# Patient Record
Sex: Female | Born: 1945 | Race: White | Hispanic: No | Marital: Married | Smoking: Current every day smoker
Health system: Southern US, Community
[De-identification: ages and names within clinical notes are randomized; demographics above are authoritative.]

## PROBLEM LIST (undated history)

## (undated) DIAGNOSIS — Z923 Personal history of irradiation: Secondary | ICD-10-CM

## (undated) DIAGNOSIS — M810 Age-related osteoporosis without current pathological fracture: Secondary | ICD-10-CM

## (undated) DIAGNOSIS — C189 Malignant neoplasm of colon, unspecified: Secondary | ICD-10-CM

## (undated) DIAGNOSIS — Z972 Presence of dental prosthetic device (complete) (partial): Secondary | ICD-10-CM

## (undated) DIAGNOSIS — E785 Hyperlipidemia, unspecified: Secondary | ICD-10-CM

## (undated) DIAGNOSIS — C50919 Malignant neoplasm of unspecified site of unspecified female breast: Secondary | ICD-10-CM

## (undated) DIAGNOSIS — M1990 Unspecified osteoarthritis, unspecified site: Secondary | ICD-10-CM

## (undated) DIAGNOSIS — C801 Malignant (primary) neoplasm, unspecified: Secondary | ICD-10-CM

## (undated) DIAGNOSIS — E041 Nontoxic single thyroid nodule: Secondary | ICD-10-CM

## (undated) HISTORY — PX: KNEE SURGERY: SHX244

## (undated) HISTORY — PX: TUBAL LIGATION: SHX77

## (undated) HISTORY — PX: TONSILLECTOMY: SUR1361

## (undated) HISTORY — PX: BREAST BIOPSY: SHX20

## (undated) HISTORY — PX: KNEE ARTHROSCOPY: SUR90

## (undated) HISTORY — PX: BREAST LUMPECTOMY: SHX2

## (undated) HISTORY — PX: CHOLECYSTECTOMY: SHX55

---

## 2003-02-22 ENCOUNTER — Other Ambulatory Visit: Payer: Self-pay

## 2007-01-15 DIAGNOSIS — C801 Malignant (primary) neoplasm, unspecified: Secondary | ICD-10-CM

## 2007-01-15 DIAGNOSIS — Z923 Personal history of irradiation: Secondary | ICD-10-CM

## 2007-01-15 HISTORY — DX: Personal history of irradiation: Z92.3

## 2007-01-15 HISTORY — DX: Malignant (primary) neoplasm, unspecified: C80.1

## 2007-01-15 HISTORY — PX: BREAST LUMPECTOMY: SHX2

## 2007-01-15 HISTORY — PX: BREAST EXCISIONAL BIOPSY: SUR124

## 2007-02-17 ENCOUNTER — Ambulatory Visit: Payer: Self-pay | Admitting: Obstetrics and Gynecology

## 2007-02-20 ENCOUNTER — Ambulatory Visit: Payer: Self-pay | Admitting: Obstetrics and Gynecology

## 2007-04-30 ENCOUNTER — Ambulatory Visit: Payer: Self-pay | Admitting: Surgery

## 2007-05-08 ENCOUNTER — Ambulatory Visit: Payer: Self-pay | Admitting: Surgery

## 2007-05-26 ENCOUNTER — Ambulatory Visit: Payer: Self-pay | Admitting: Surgery

## 2007-06-15 ENCOUNTER — Ambulatory Visit: Payer: Self-pay | Admitting: Internal Medicine

## 2007-07-15 ENCOUNTER — Ambulatory Visit: Payer: Self-pay | Admitting: Internal Medicine

## 2007-08-15 ENCOUNTER — Ambulatory Visit: Payer: Self-pay | Admitting: Internal Medicine

## 2007-09-15 ENCOUNTER — Ambulatory Visit: Payer: Self-pay | Admitting: Internal Medicine

## 2007-10-15 ENCOUNTER — Ambulatory Visit: Payer: Self-pay | Admitting: Internal Medicine

## 2007-11-15 ENCOUNTER — Ambulatory Visit: Payer: Self-pay | Admitting: Internal Medicine

## 2007-11-30 ENCOUNTER — Ambulatory Visit: Payer: Self-pay | Admitting: Internal Medicine

## 2007-12-15 ENCOUNTER — Ambulatory Visit: Payer: Self-pay | Admitting: Internal Medicine

## 2008-01-15 ENCOUNTER — Ambulatory Visit: Payer: Self-pay | Admitting: Internal Medicine

## 2008-03-10 ENCOUNTER — Ambulatory Visit: Payer: Self-pay | Admitting: Internal Medicine

## 2008-03-10 ENCOUNTER — Ambulatory Visit: Payer: Self-pay | Admitting: Radiation Oncology

## 2008-03-14 ENCOUNTER — Ambulatory Visit: Payer: Self-pay | Admitting: Radiation Oncology

## 2008-03-29 ENCOUNTER — Ambulatory Visit: Payer: Self-pay | Admitting: Internal Medicine

## 2008-04-14 ENCOUNTER — Ambulatory Visit: Payer: Self-pay | Admitting: Internal Medicine

## 2008-04-14 ENCOUNTER — Ambulatory Visit: Payer: Self-pay | Admitting: Radiation Oncology

## 2008-07-26 ENCOUNTER — Ambulatory Visit: Payer: Self-pay | Admitting: Internal Medicine

## 2008-08-14 ENCOUNTER — Ambulatory Visit: Payer: Self-pay | Admitting: Radiation Oncology

## 2008-08-26 ENCOUNTER — Ambulatory Visit: Payer: Self-pay | Admitting: Internal Medicine

## 2008-09-14 ENCOUNTER — Ambulatory Visit: Payer: Self-pay | Admitting: Radiation Oncology

## 2008-09-30 ENCOUNTER — Ambulatory Visit: Payer: Self-pay | Admitting: Internal Medicine

## 2008-10-14 ENCOUNTER — Ambulatory Visit: Payer: Self-pay | Admitting: Internal Medicine

## 2008-10-14 ENCOUNTER — Ambulatory Visit: Payer: Self-pay | Admitting: Radiation Oncology

## 2009-01-14 ENCOUNTER — Ambulatory Visit: Payer: Self-pay | Admitting: Internal Medicine

## 2009-01-30 ENCOUNTER — Ambulatory Visit: Payer: Self-pay | Admitting: Internal Medicine

## 2009-02-01 ENCOUNTER — Ambulatory Visit: Payer: Self-pay | Admitting: Internal Medicine

## 2009-02-14 ENCOUNTER — Ambulatory Visit: Payer: Self-pay | Admitting: Internal Medicine

## 2009-07-14 ENCOUNTER — Ambulatory Visit: Payer: Self-pay | Admitting: Internal Medicine

## 2009-08-01 ENCOUNTER — Ambulatory Visit: Payer: Self-pay | Admitting: Internal Medicine

## 2009-08-14 ENCOUNTER — Ambulatory Visit: Payer: Self-pay | Admitting: Internal Medicine

## 2009-09-14 ENCOUNTER — Ambulatory Visit: Payer: Self-pay | Admitting: Internal Medicine

## 2010-02-22 ENCOUNTER — Ambulatory Visit: Payer: Self-pay | Admitting: Internal Medicine

## 2010-02-26 ENCOUNTER — Ambulatory Visit: Payer: Self-pay | Admitting: Internal Medicine

## 2010-03-15 ENCOUNTER — Ambulatory Visit: Payer: Self-pay | Admitting: Internal Medicine

## 2010-08-22 ENCOUNTER — Ambulatory Visit: Payer: Self-pay | Admitting: Internal Medicine

## 2010-08-24 ENCOUNTER — Ambulatory Visit: Payer: Self-pay | Admitting: Internal Medicine

## 2010-09-15 ENCOUNTER — Ambulatory Visit: Payer: Self-pay | Admitting: Internal Medicine

## 2011-01-15 DIAGNOSIS — C189 Malignant neoplasm of colon, unspecified: Secondary | ICD-10-CM

## 2011-01-15 HISTORY — DX: Malignant neoplasm of colon, unspecified: C18.9

## 2011-02-26 ENCOUNTER — Ambulatory Visit: Payer: Self-pay | Admitting: Internal Medicine

## 2011-03-11 ENCOUNTER — Ambulatory Visit: Payer: Self-pay | Admitting: Internal Medicine

## 2011-03-11 LAB — CBC CANCER CENTER
Basophil %: 0.3 %
Eosinophil #: 0.1 x10 3/mm (ref 0.0–0.7)
HCT: 36.1 % (ref 35.0–47.0)
HGB: 12.5 g/dL (ref 12.0–16.0)
Lymphocyte #: 1.5 x10 3/mm (ref 1.0–3.6)
MCH: 31.9 pg (ref 26.0–34.0)
MCHC: 34.5 g/dL (ref 32.0–36.0)
Monocyte #: 0.4 x10 3/mm (ref 0.0–0.7)
Neutrophil %: 67.5 %
Platelet: 185 x10 3/mm (ref 150–440)

## 2011-03-11 LAB — HEPATIC FUNCTION PANEL A (ARMC)
Alkaline Phosphatase: 91 U/L (ref 50–136)
Bilirubin, Direct: 0.1 mg/dL (ref 0.00–0.20)
SGOT(AST): 20 U/L (ref 15–37)

## 2011-03-11 LAB — CREATININE, SERUM
EGFR (African American): >60
EGFR (Non-African Amer.): >60

## 2011-03-15 ENCOUNTER — Ambulatory Visit: Payer: Self-pay | Admitting: Internal Medicine

## 2011-08-27 ENCOUNTER — Ambulatory Visit: Payer: Self-pay | Admitting: Internal Medicine

## 2011-09-20 ENCOUNTER — Ambulatory Visit: Payer: Self-pay | Admitting: Internal Medicine

## 2011-09-20 LAB — HEPATIC FUNCTION PANEL A (ARMC)
Albumin: 4 g/dL (ref 3.4–5.0)
Alkaline Phosphatase: 100 U/L (ref 50–136)
Bilirubin, Direct: 0.2 mg/dL (ref 0.00–0.20)
SGOT(AST): 24 U/L (ref 15–37)
Total Protein: 7.3 g/dL (ref 6.4–8.2)

## 2011-09-20 LAB — CBC CANCER CENTER
Basophil #: 0 x10 3/mm (ref 0.0–0.1)
Basophil %: 0.6 %
Eosinophil #: 0.1 x10 3/mm (ref 0.0–0.7)
Eosinophil %: 1.7 %
HCT: 41.4 % (ref 35.0–47.0)
HGB: 13.6 g/dL (ref 12.0–16.0)
Lymphocyte #: 1.8 x10 3/mm (ref 1.0–3.6)
Lymphocyte %: 32 %
MCHC: 32.9 g/dL (ref 32.0–36.0)
Monocyte #: 0.4 x10 3/mm (ref 0.2–0.9)
Neutrophil %: 58.3 %
Platelet: 182 x10 3/mm (ref 150–440)
WBC: 5.5 x10 3/mm (ref 3.6–11.0)

## 2011-09-20 LAB — CREATININE, SERUM: EGFR (African American): >60

## 2011-10-15 ENCOUNTER — Ambulatory Visit: Payer: Self-pay | Admitting: Internal Medicine

## 2012-02-18 ENCOUNTER — Ambulatory Visit: Payer: Self-pay | Admitting: Internal Medicine

## 2012-03-19 ENCOUNTER — Ambulatory Visit: Payer: Self-pay | Admitting: Internal Medicine

## 2012-03-25 LAB — CBC CANCER CENTER
Basophil %: 0.9 %
Eosinophil #: 0.1 x10 3/mm (ref 0.0–0.7)
Eosinophil %: 2.3 %
HCT: 36.8 % (ref 35.0–47.0)
HGB: 12.4 g/dL (ref 12.0–16.0)
Lymphocyte %: 31.4 %
MCHC: 33.6 g/dL (ref 32.0–36.0)
MCV: 92 fL (ref 80–100)
Monocyte #: 0.4 x10 3/mm (ref 0.2–0.9)
Platelet: 159 x10 3/mm (ref 150–440)
RBC: 4.02 10*6/uL (ref 3.80–5.20)

## 2012-03-25 LAB — BASIC METABOLIC PANEL
BUN: 15 mg/dL (ref 7–18)
Calcium, Total: 9.3 mg/dL (ref 8.5–10.1)
Co2: 30 mmol/L (ref 21–32)
EGFR (Non-African Amer.): >60
Osmolality: 287 (ref 275–301)

## 2012-03-25 LAB — HEPATIC FUNCTION PANEL A (ARMC)
SGOT(AST): 16 U/L (ref 15–37)
SGPT (ALT): 21 U/L (ref 12–78)
Total Protein: 6.7 g/dL (ref 6.4–8.2)

## 2012-04-14 ENCOUNTER — Ambulatory Visit: Payer: Self-pay | Admitting: Internal Medicine

## 2012-06-17 ENCOUNTER — Ambulatory Visit: Payer: Self-pay | Admitting: Orthopedic Surgery

## 2012-08-21 ENCOUNTER — Ambulatory Visit: Payer: Self-pay | Admitting: Unknown Physician Specialty

## 2013-01-14 HISTORY — PX: COLON SURGERY: SHX602

## 2013-01-29 ENCOUNTER — Ambulatory Visit: Payer: Self-pay | Admitting: Gastroenterology

## 2013-02-24 ENCOUNTER — Ambulatory Visit: Payer: Self-pay | Admitting: Internal Medicine

## 2013-02-25 ENCOUNTER — Ambulatory Visit: Payer: Self-pay | Admitting: Surgery

## 2013-02-25 LAB — CBC WITH DIFFERENTIAL/PLATELET
BASOS ABS: 0 10*3/uL (ref 0.0–0.1)
Basophil %: 0.6 %
EOS ABS: 0.1 10*3/uL (ref 0.0–0.7)
Eosinophil %: 2 %
HCT: 38.6 % (ref 35.0–47.0)
HGB: 12.5 g/dL (ref 12.0–16.0)
LYMPHS ABS: 1.8 10*3/uL (ref 1.0–3.6)
Lymphocyte %: 28.8 %
MCH: 30.1 pg (ref 26.0–34.0)
MCHC: 32.3 g/dL (ref 32.0–36.0)
MCV: 93 fL (ref 80–100)
Monocyte #: 0.4 x10 3/mm (ref 0.2–0.9)
Monocyte %: 6.9 %
Neutrophil #: 3.9 10*3/uL (ref 1.4–6.5)
Neutrophil %: 61.7 %
PLATELETS: 210 10*3/uL (ref 150–440)
RBC: 4.15 10*6/uL (ref 3.80–5.20)
RDW: 12.1 % (ref 11.5–14.5)
WBC: 6.3 10*3/uL (ref 3.6–11.0)

## 2013-02-25 LAB — BASIC METABOLIC PANEL
ANION GAP: 2 — AB (ref 7–16)
BUN: 19 mg/dL — ABNORMAL HIGH (ref 7–18)
CALCIUM: 9.5 mg/dL (ref 8.5–10.1)
CHLORIDE: 108 mmol/L — AB (ref 98–107)
CO2: 28 mmol/L (ref 21–32)
CREATININE: 0.82 mg/dL (ref 0.60–1.30)
EGFR (African American): >60
GLUCOSE: 85 mg/dL (ref 65–99)
OSMOLALITY: 277 (ref 275–301)
Potassium: 4.1 mmol/L (ref 3.5–5.1)
Sodium: 138 mmol/L (ref 136–145)

## 2013-02-26 LAB — CEA: CEA: 2 ng/mL (ref 0.0–4.7)

## 2013-02-26 LAB — HEPATIC FUNCTION PANEL A (ARMC)
ALBUMIN: 3.8 g/dL (ref 3.4–5.0)
ALK PHOS: 85 U/L
BILIRUBIN TOTAL: 0.5 mg/dL (ref 0.2–1.0)
Bilirubin, Direct: <0.1 mg/dL (ref 0.00–0.20)
SGOT(AST): 15 U/L (ref 15–37)
SGPT (ALT): 24 U/L (ref 12–78)
TOTAL PROTEIN: 7.3 g/dL (ref 6.4–8.2)

## 2013-03-01 ENCOUNTER — Ambulatory Visit: Payer: Self-pay | Admitting: Internal Medicine

## 2013-03-03 ENCOUNTER — Inpatient Hospital Stay: Payer: Self-pay | Admitting: Surgery

## 2013-03-03 LAB — CREATININE, SERUM
Creatinine: 0.95 mg/dL (ref 0.60–1.30)
EGFR (African American): >60

## 2013-03-05 LAB — BASIC METABOLIC PANEL
Anion Gap: 2 — ABNORMAL LOW (ref 7–16)
BUN: 2 mg/dL — ABNORMAL LOW (ref 7–18)
CHLORIDE: 108 mmol/L — AB (ref 98–107)
CREATININE: 0.8 mg/dL (ref 0.60–1.30)
Calcium, Total: 8.9 mg/dL (ref 8.5–10.1)
Co2: 29 mmol/L (ref 21–32)
EGFR (African American): >60
EGFR (Non-African Amer.): >60
Glucose: 116 mg/dL — ABNORMAL HIGH (ref 65–99)
Osmolality: 275 (ref 275–301)
POTASSIUM: 3.7 mmol/L (ref 3.5–5.1)
Sodium: 139 mmol/L (ref 136–145)

## 2013-03-05 LAB — CBC WITH DIFFERENTIAL/PLATELET
BASOS PCT: 0.5 %
Basophil #: 0 10*3/uL (ref 0.0–0.1)
EOS ABS: 0.1 10*3/uL (ref 0.0–0.7)
Eosinophil %: 1.4 %
HCT: 29.9 % — ABNORMAL LOW (ref 35.0–47.0)
HGB: 10.3 g/dL — ABNORMAL LOW (ref 12.0–16.0)
Lymphocyte #: 0.9 10*3/uL — ABNORMAL LOW (ref 1.0–3.6)
Lymphocyte %: 12.1 %
MCH: 31.2 pg (ref 26.0–34.0)
MCHC: 34.3 g/dL (ref 32.0–36.0)
MCV: 91 fL (ref 80–100)
Monocyte #: 0.5 x10 3/mm (ref 0.2–0.9)
Monocyte %: 6.9 %
NEUTROS PCT: 79.1 %
Neutrophil #: 5.9 10*3/uL (ref 1.4–6.5)
PLATELETS: 122 10*3/uL — AB (ref 150–440)
RBC: 3.28 10*6/uL — AB (ref 3.80–5.20)
RDW: 11.9 % (ref 11.5–14.5)
WBC: 7.5 10*3/uL (ref 3.6–11.0)

## 2013-03-05 LAB — PATHOLOGY REPORT

## 2013-03-14 ENCOUNTER — Ambulatory Visit: Payer: Self-pay | Admitting: Internal Medicine

## 2013-03-17 ENCOUNTER — Ambulatory Visit: Payer: Self-pay | Admitting: Internal Medicine

## 2013-03-29 LAB — BASIC METABOLIC PANEL
Anion Gap: 1 — ABNORMAL LOW (ref 7–16)
BUN: 14 mg/dL (ref 7–18)
CHLORIDE: 109 mmol/L — AB (ref 98–107)
CREATININE: 0.85 mg/dL (ref 0.60–1.30)
Calcium, Total: 9.3 mg/dL (ref 8.5–10.1)
Co2: 29 mmol/L (ref 21–32)
EGFR (African American): >60
GLUCOSE: 93 mg/dL (ref 65–99)
Osmolality: 278 (ref 275–301)
POTASSIUM: 4.5 mmol/L (ref 3.5–5.1)
Sodium: 139 mmol/L (ref 136–145)

## 2013-04-14 ENCOUNTER — Ambulatory Visit: Payer: Self-pay | Admitting: Internal Medicine

## 2014-03-22 ENCOUNTER — Ambulatory Visit: Payer: Self-pay | Admitting: Internal Medicine

## 2014-03-22 DIAGNOSIS — R922 Inconclusive mammogram: Secondary | ICD-10-CM | POA: Diagnosis not present

## 2014-04-04 ENCOUNTER — Ambulatory Visit
Admit: 2014-04-04 | Disposition: A | Discharge status: Home / Self Care | Payer: Self-pay | Attending: Internal Medicine | Admitting: Internal Medicine

## 2014-04-04 DIAGNOSIS — M818 Other osteoporosis without current pathological fracture: Secondary | ICD-10-CM | POA: Diagnosis not present

## 2014-04-04 DIAGNOSIS — E079 Disorder of thyroid, unspecified: Secondary | ICD-10-CM | POA: Diagnosis not present

## 2014-04-04 DIAGNOSIS — Z9049 Acquired absence of other specified parts of digestive tract: Secondary | ICD-10-CM | POA: Diagnosis not present

## 2014-04-04 DIAGNOSIS — F17210 Nicotine dependence, cigarettes, uncomplicated: Secondary | ICD-10-CM | POA: Diagnosis not present

## 2014-04-04 DIAGNOSIS — Z85038 Personal history of other malignant neoplasm of large intestine: Secondary | ICD-10-CM | POA: Diagnosis not present

## 2014-04-04 DIAGNOSIS — Z853 Personal history of malignant neoplasm of breast: Secondary | ICD-10-CM | POA: Diagnosis not present

## 2014-04-05 DIAGNOSIS — Z853 Personal history of malignant neoplasm of breast: Secondary | ICD-10-CM | POA: Diagnosis not present

## 2014-04-05 DIAGNOSIS — F17210 Nicotine dependence, cigarettes, uncomplicated: Secondary | ICD-10-CM | POA: Diagnosis not present

## 2014-04-05 DIAGNOSIS — Z85038 Personal history of other malignant neoplasm of large intestine: Secondary | ICD-10-CM | POA: Diagnosis not present

## 2014-04-05 DIAGNOSIS — E079 Disorder of thyroid, unspecified: Secondary | ICD-10-CM | POA: Diagnosis not present

## 2014-04-05 DIAGNOSIS — M818 Other osteoporosis without current pathological fracture: Secondary | ICD-10-CM | POA: Diagnosis not present

## 2014-04-05 DIAGNOSIS — E041 Nontoxic single thyroid nodule: Secondary | ICD-10-CM | POA: Diagnosis not present

## 2014-04-05 DIAGNOSIS — Z9049 Acquired absence of other specified parts of digestive tract: Secondary | ICD-10-CM | POA: Diagnosis not present

## 2014-04-05 DIAGNOSIS — C189 Malignant neoplasm of colon, unspecified: Secondary | ICD-10-CM | POA: Diagnosis not present

## 2014-04-15 ENCOUNTER — Ambulatory Visit
Admit: 2014-04-15 | Disposition: A | Discharge status: Home / Self Care | Payer: Self-pay | Attending: Internal Medicine | Admitting: Internal Medicine

## 2014-04-19 DIAGNOSIS — Z85038 Personal history of other malignant neoplasm of large intestine: Secondary | ICD-10-CM | POA: Diagnosis not present

## 2014-04-19 DIAGNOSIS — Z853 Personal history of malignant neoplasm of breast: Secondary | ICD-10-CM | POA: Diagnosis not present

## 2014-04-19 DIAGNOSIS — Z79899 Other long term (current) drug therapy: Secondary | ICD-10-CM | POA: Diagnosis not present

## 2014-04-19 DIAGNOSIS — M818 Other osteoporosis without current pathological fracture: Secondary | ICD-10-CM | POA: Diagnosis not present

## 2014-04-19 DIAGNOSIS — M8589 Other specified disorders of bone density and structure, multiple sites: Secondary | ICD-10-CM | POA: Diagnosis not present

## 2014-04-21 DIAGNOSIS — E041 Nontoxic single thyroid nodule: Secondary | ICD-10-CM | POA: Diagnosis not present

## 2014-04-29 ENCOUNTER — Other Ambulatory Visit: Payer: Self-pay | Admitting: Unknown Physician Specialty

## 2014-04-29 DIAGNOSIS — M818 Other osteoporosis without current pathological fracture: Secondary | ICD-10-CM | POA: Diagnosis not present

## 2014-04-29 DIAGNOSIS — Z853 Personal history of malignant neoplasm of breast: Secondary | ICD-10-CM | POA: Diagnosis not present

## 2014-04-29 DIAGNOSIS — Z79899 Other long term (current) drug therapy: Secondary | ICD-10-CM | POA: Diagnosis not present

## 2014-04-29 DIAGNOSIS — E041 Nontoxic single thyroid nodule: Secondary | ICD-10-CM

## 2014-04-29 DIAGNOSIS — Z85038 Personal history of other malignant neoplasm of large intestine: Secondary | ICD-10-CM | POA: Diagnosis not present

## 2014-04-29 LAB — CREATININE, SERUM
Creatinine: 0.8 mg/dL
EGFR (African American): >60
EGFR (Non-African Amer.): >60

## 2014-04-29 LAB — CALCIUM: Calcium, Total: 10.5 mg/dL — ABNORMAL HIGH

## 2014-05-07 NOTE — Op Note (Signed)
PATIENT NAME:  Laura Graham, Laura Graham MR#:  [id number] DATE OF BIRTH:  February 10, 1945  DATE OF PROCEDURE:  03/03/2013  PREOPERATIVE DIAGNOSIS: Sigmoid colon carcinoma.   POSTOPERATIVE DIAGNOSIS: Sigmoid colon carcinoma.   OPERATION: Robot-assisted laparoscopic sigmoid resection.   ANESTHESIA: General.   SURGEON: Rodena Goldmann.   OPERATIVE PROCEDURE: With the patient in the supine position, after induction of appropriate general anesthesia, the patient was placed in the lithotomy position, appropriately padded and positioned. Foley catheter was placed. Her abdomen was prepped with Betadine and draped with alcohol and draped in sterile towels. A right upper quadrant incision 2 cm above and 2 lateral to the umbilicus was made transversely and Veress needle used to cannulate the peritoneal cavity. CO2 was insufflated to appropriate pressure measurements. A 12 mm port was inserted into the abdominal cavity and position confirmed with the laparoscope. A subxiphoid transverse incision was made and 8.5 robotic port inserted. A left mid abdominal incision was made laterally and a 8 mm abdominal robotic port inserted. In the right lower quadrant transverse incision was made and an 8 mm robotic port inserted. The patient was appropriately positioned and the robot brought to the table and docked. The descending colon and splenic flexure were addressed first. The colon was mobilized around the descending and splenic flexure without difficulty. A combination of blunt and Bovie dissection was utilized to approximately the third of the transverse colon. Examining the distal sigmoid the lesion appeared to be in the proximal sigmoid and the ink tattoo was visualized. The patient was then undocked, rotated slightly, then redocked to better approach the pelvis. The left ureter was identified over the left iliac artery and vein and carefully preserved. Dissection was carried out down into the pelvis. The bowel appeared to be  sufficiently mobile. The abdomen was then desufflated after undocking the patient and repositioning the patient. A 6 cm left lower quadrant incision was made to extend the previous incision, carried down through the subcutaneous tissue using Bovie electrocautery. Anterior fascia was identified and incised the length of the skin incision as was the peritoneum. The bowel was visualized, elevated the incision without difficulty. An area was chosen approximately 5 to 6 cm in either side of the palpable lesion. Bowel was divided with a GIA 55 stapling device. A wedge of mesentery was then dissected free using the LigaSure apparatus. Two pieces of bowel were placed side by side. A small enterotomy was made in each loop of bowel along the antimesenteric border and another application of the GIA stapling device used to create a common channel. The anastomosis appeared to be satisfactory. The enterotomy was closed with 3-0 silk in a single application of the IO-97 stapling device carrying a blue load. Seromuscular suture placed over the anastomosis closure. Mesenteric defect was closed with 3-0 Vicryl in a running fashion. Bowel contents were returned to their anatomic position. The area was copiously irrigated. Anastomosis appeared to be satisfactory. Bowel contents were returned to their anatomic position. A wound protector had been utilized and was removed. The peritoneum was closed with running suture of 0 Vicryl. The fascia closed with interrupted figure-of-eight sutures of 0 Maxon. The skin was closed with 3-0 nylon, benzoin and Steri-Strips. Wounds were dressed sterilely. The patient taken to the recovery room having tolerated the procedure  well. Sponge, instrument and needle counts were correct x 2 in the operating room.    ____________________________ Micheline Maze, MD rle:sg D: 03/03/2013 11:37:54 ET T: 03/03/2013 12:44:42 ET JOB#: [id number]  cc: Micheline Maze, MD, <Dictator> Lucilla Lame, MD  Rodena Goldmann MD ELECTRONICALLY SIGNED 03/04/2013 12:56

## 2014-05-07 NOTE — Discharge Summary (Signed)
PATIENT NAME:  Laura Graham, Laura Graham MR#:  [id number] DATE OF BIRTH:  15-Dec-1945  DATE OF ADMISSION:  03/03/2013 DATE OF DISCHARGE:  03/08/2013  BRIEF HISTORY: Laura Graham is a 69 year old woman with a recently identified colon carcinoma. She was admitted with plan of pursuing elective colon resection. After appropriate preoperative preparation and informed consent, she was taken to surgery on the morning of March 03, 2013. The patient had undergone mechanical bowel prep. A robot-assisted laparoscopic sigmoid colectomy was performed without difficulty. She had no significant postoperative problems. She had rapid return of bowel function. She was able to tolerate a liquid diet on the second hospital day, advanced to a soft diet on the fourth hospital day. She is up and ambulating without difficulty, tolerating a regular diet. Her wounds look good. There is no sign of any infection.   DISCHARGE MEDICATIONS: Include vitamin D3 2000 units once a day, Zantac 150 mg once a day p.r.n., calcium 600 mg once a day, acetaminophen 325/hydrocodone 5 every 4 to 6 hours p.r.n.   FINAL DISCHARGE DIAGNOSIS: Colon carcinoma.   SURGERY: Robot-assisted laparoscopic cholecystectomy.  ____________________________ Rodena Goldmann III, MD rle:aw D: 03/08/2013 07:08:49 ET T: 03/08/2013 10:29:00 ET JOB#: [id number]  cc: Rodena Goldmann III, MD, <Dictator> Lucilla Lame, MD Raye Sorrow, MD Rodena Goldmann MD ELECTRONICALLY SIGNED 03/17/2013 7:37

## 2014-05-31 DIAGNOSIS — Z85038 Personal history of other malignant neoplasm of large intestine: Secondary | ICD-10-CM | POA: Diagnosis not present

## 2014-06-23 ENCOUNTER — Other Ambulatory Visit: Payer: Self-pay

## 2014-06-23 ENCOUNTER — Telehealth: Payer: Self-pay

## 2014-06-23 NOTE — Telephone Encounter (Signed)
-----   Message from Otilio Jefferson sent at 06/20/2014  9:13 AM EDT ----- Regarding: Referrals From: Tia Masker Sent: 06/01/2014  Office Visit: 04/28/2013  triage

## 2014-06-23 NOTE — Telephone Encounter (Signed)
LVM for pt to return my call.

## 2014-06-27 ENCOUNTER — Other Ambulatory Visit: Payer: Self-pay

## 2014-06-27 DIAGNOSIS — Z85038 Personal history of other malignant neoplasm of large intestine: Secondary | ICD-10-CM

## 2014-06-27 MED ORDER — NA SULFATE-K SULFATE-MG SULF 17.5-3.13-1.6 GM/177ML PO SOLN: 1.0000 | ORAL | Status: DC

## 2014-06-27 NOTE — Telephone Encounter (Signed)
Pt scheduled for a colonoscopy on 07-22-14. Instructs/rx mailed.

## 2014-06-27 NOTE — Progress Notes (Signed)
Gastroenterology Pre-Procedure Review  Request Date: 07-22-14 Requesting Physician: Dr. Manuella Ghazi  PATIENT REVIEW QUESTIONS: The patient responded to the following health history questions as indicated:    1. Are you having any GI issues? no 2. Do you have a personal history of Polyps? yes (colon cancer) 3. Do you have a family history of Colon Cancer or Polyps? no 4. Diabetes Mellitus? no 5. Joint replacements in the past 12 months?no 6. Major health problems in the past 3 months?no 7. Any artificial heart valves, MVP, or defibrillator?no    MEDICATIONS & ALLERGIES:    Patient reports the following regarding taking any anticoagulation/antiplatelet therapy:   Plavix, Coumadin, Eliquis, Xarelto, Lovenox, Pradaxa, Brilinta, or Effient? no Aspirin? no  Patient confirms/reports the following medications:  No current outpatient prescriptions on file.   No current facility-administered medications for this visit.    Patient confirms/reports the following allergies:  Allergies not on file NKDA  No orders of the defined types were placed in this encounter.    AUTHORIZATION INFORMATION Primary Insurance: 1D#: Group #:  Secondary Insurance: 1D#: Group #:  SCHEDULE INFORMATION: Date: 06-27-14 Time: Location: [place]

## 2014-06-28 ENCOUNTER — Telehealth: Payer: Self-pay

## 2014-06-28 NOTE — Telephone Encounter (Signed)
-----   Message from Otilio Jefferson sent at 06/20/2014  9:13 AM EDT ----- Regarding: Referrals From: Tia Masker Sent: 06/01/2014  Office Visit: 04/28/2013  triage

## 2014-06-28 NOTE — Telephone Encounter (Signed)
Pt scheduled for colonoscopy at [facility] on 07-22-14. Instructs/rx mailed.

## 2014-07-13 ENCOUNTER — Encounter: Payer: Self-pay | Admitting: *Deleted

## 2014-07-20 NOTE — Discharge Instructions (Signed)

## 2014-07-22 ENCOUNTER — Encounter
Admission: RE | Disposition: A | Discharge status: Home / Self Care | Payer: Self-pay | Source: Ambulatory Visit | Attending: Gastroenterology

## 2014-07-22 ENCOUNTER — Encounter: Payer: Self-pay | Admitting: Gastroenterology

## 2014-07-22 ENCOUNTER — Ambulatory Visit
Admission: RE | Admit: 2014-07-22 | Discharge: 2014-07-22 | Disposition: A | Discharge status: Home / Self Care | Payer: Medicare Other | Source: Ambulatory Visit | Attending: Gastroenterology | Admitting: Gastroenterology

## 2014-07-22 ENCOUNTER — Ambulatory Visit: Payer: Medicare Other | Admitting: Anesthesiology

## 2014-07-22 ENCOUNTER — Other Ambulatory Visit: Payer: Self-pay | Admitting: Gastroenterology

## 2014-07-22 DIAGNOSIS — Z79899 Other long term (current) drug therapy: Secondary | ICD-10-CM | POA: Insufficient documentation

## 2014-07-22 DIAGNOSIS — F17200 Nicotine dependence, unspecified, uncomplicated: Secondary | ICD-10-CM | POA: Insufficient documentation

## 2014-07-22 DIAGNOSIS — K5730 Diverticulosis of large intestine without perforation or abscess without bleeding: Secondary | ICD-10-CM | POA: Diagnosis not present

## 2014-07-22 DIAGNOSIS — M19042 Primary osteoarthritis, left hand: Secondary | ICD-10-CM | POA: Diagnosis not present

## 2014-07-22 DIAGNOSIS — Z9049 Acquired absence of other specified parts of digestive tract: Secondary | ICD-10-CM | POA: Insufficient documentation

## 2014-07-22 DIAGNOSIS — M19041 Primary osteoarthritis, right hand: Secondary | ICD-10-CM | POA: Diagnosis not present

## 2014-07-22 DIAGNOSIS — M170 Bilateral primary osteoarthritis of knee: Secondary | ICD-10-CM | POA: Diagnosis not present

## 2014-07-22 DIAGNOSIS — D125 Benign neoplasm of sigmoid colon: Secondary | ICD-10-CM | POA: Insufficient documentation

## 2014-07-22 DIAGNOSIS — D122 Benign neoplasm of ascending colon: Secondary | ICD-10-CM | POA: Diagnosis not present

## 2014-07-22 DIAGNOSIS — Z09 Encounter for follow-up examination after completed treatment for conditions other than malignant neoplasm: Secondary | ICD-10-CM | POA: Diagnosis present

## 2014-07-22 DIAGNOSIS — Z85038 Personal history of other malignant neoplasm of large intestine: Secondary | ICD-10-CM | POA: Insufficient documentation

## 2014-07-22 DIAGNOSIS — Z853 Personal history of malignant neoplasm of breast: Secondary | ICD-10-CM | POA: Insufficient documentation

## 2014-07-22 DIAGNOSIS — Z980 Intestinal bypass and anastomosis status: Secondary | ICD-10-CM | POA: Insufficient documentation

## 2014-07-22 HISTORY — PX: COLONOSCOPY WITH PROPOFOL: SHX5780

## 2014-07-22 HISTORY — DX: Malignant (primary) neoplasm, unspecified: C80.1

## 2014-07-22 HISTORY — PX: POLYPECTOMY: SHX5525

## 2014-07-22 HISTORY — DX: Unspecified osteoarthritis, unspecified site: M19.90

## 2014-07-22 HISTORY — DX: Presence of dental prosthetic device (complete) (partial): Z97.2

## 2014-07-22 SURGERY — COLONOSCOPY WITH PROPOFOL
Anesthesia: Monitor Anesthesia Care | Wound class: Contaminated

## 2014-07-22 MED ORDER — LACTATED RINGERS IV SOLN
INTRAVENOUS | Status: DC
  Administered 2014-07-22: 08:00:00 via INTRAVENOUS

## 2014-07-22 MED ORDER — PROPOFOL 10 MG/ML IV BOLUS
INTRAVENOUS | Status: DC | PRN
  Administered 2014-07-22: 20 mg via INTRAVENOUS
  Administered 2014-07-22: 30 mg via INTRAVENOUS
  Administered 2014-07-22 (×2): 20 mg via INTRAVENOUS
  Administered 2014-07-22: 30 mg via INTRAVENOUS
  Administered 2014-07-22: 20 mg via INTRAVENOUS
  Administered 2014-07-22: 50 mg via INTRAVENOUS
  Administered 2014-07-22: 30 mg via INTRAVENOUS

## 2014-07-22 MED ORDER — STERILE WATER FOR IRRIGATION IR SOLN
Status: DC | PRN
  Administered 2014-07-22: 09:00:00

## 2014-07-22 MED ORDER — LIDOCAINE HCL (CARDIAC) 20 MG/ML IV SOLN
INTRAVENOUS | Status: DC | PRN
  Administered 2014-07-22: 50 mg via INTRAVENOUS

## 2014-07-22 SURGICAL SUPPLY — 28 items
CANISTER SUCT 1200ML W/VALVE (MISCELLANEOUS) ×4 IMPLANT
FCP ESCP3.2XJMB 240X2.8X (MISCELLANEOUS)
FORCEPS BIOP RAD 4 LRG CAP 4 (CUTTING FORCEPS) IMPLANT
FORCEPS BIOP RJ4 240 W/NDL (MISCELLANEOUS)
FORCEPS ESCP3.2XJMB 240X2.8X (MISCELLANEOUS) IMPLANT
GOWN CVR UNV OPN BCK APRN NK (MISCELLANEOUS) ×4 IMPLANT
GOWN ISOL THUMB LOOP REG UNIV (MISCELLANEOUS) ×4
HEMOCLIP INSTINCT (CLIP) IMPLANT
INJECTOR VARIJECT VIN23 (MISCELLANEOUS) IMPLANT
KIT CO2 TUBING (TUBING) IMPLANT
KIT DEFENDO VALVE AND CONN (KITS) IMPLANT
KIT ENDO PROCEDURE OLY (KITS) ×4 IMPLANT
LIGATOR MULTIBAND 6SHOOTER MBL (MISCELLANEOUS) IMPLANT
MARKER SPOT ENDO TATTOO 5ML (MISCELLANEOUS) IMPLANT
PAD GROUND ADULT SPLIT (MISCELLANEOUS) IMPLANT
SNARE SHORT THROW 13M SML OVAL (MISCELLANEOUS) ×4 IMPLANT
SNARE SHORT THROW 30M LRG OVAL (MISCELLANEOUS) IMPLANT
SPOT EX ENDOSCOPIC TATTOO (MISCELLANEOUS)
SUCTION POLY TRAP 4CHAMBER (MISCELLANEOUS) IMPLANT
TRAP SUCTION POLY (MISCELLANEOUS) ×4 IMPLANT
TUBING CONN 6MMX3.1M (TUBING)
TUBING SUCTION CONN 0.25 STRL (TUBING) IMPLANT
UNDERPAD 30X60 958B10 (PK) (MISCELLANEOUS) IMPLANT
VALVE BIOPSY ENDO (VALVE) IMPLANT
VARIJECT INJECTOR VIN23 (MISCELLANEOUS)
WATER AUXILLARY (MISCELLANEOUS) IMPLANT
WATER STERILE IRR 250ML POUR (IV SOLUTION) ×4 IMPLANT
WATER STERILE IRR 500ML POUR (IV SOLUTION) IMPLANT

## 2014-07-22 NOTE — Anesthesia Preprocedure Evaluation (Addendum)
Anesthesia Evaluation  Patient identified by MRN, date of birth, ID band Patient awake    Reviewed: Allergy & Precautions, NPO status   Airway Mallampati: II  TM Distance: >3 FB Neck ROM: full    Dental   Front upper bridge:   Pulmonary Current Smoker,    Pulmonary exam normal       Cardiovascular negative cardio ROS Normal cardiovascular exam    Neuro/Psych negative neurological ROS     GI/Hepatic negative GI ROS, Neg liver ROS,   Endo/Other  negative endocrine ROS  Renal/GU negative Renal ROS  negative genitourinary   Musculoskeletal   Abdominal   Peds  Hematology negative hematology ROS (+)   Anesthesia Other Findings   Reproductive/Obstetrics negative OB ROS                            Anesthesia Physical Anesthesia Plan  ASA: II  Anesthesia Plan: MAC   Post-op Pain Management:    Induction: Intravenous  Airway Management Planned: Simple Face Mask  Additional Equipment:   Intra-op Plan:   Post-operative Plan:   Informed Consent: I have reviewed the patients History and Physical, chart, labs and discussed the procedure including the risks, benefits and alternatives for the proposed anesthesia with the patient or authorized representative who has indicated his/her understanding and acceptance.     Plan Discussed with:   Anesthesia Plan Comments:         Anesthesia Quick Evaluation

## 2014-07-22 NOTE — Anesthesia Postprocedure Evaluation (Signed)
  Anesthesia Post-op Note  Patient: Laura Graham  Procedure(s) Performed: Procedure(s): COLONOSCOPY WITH PROPOFOL (N/A) POLYPECTOMY  Anesthesia type:MAC  Patient location: PACU  Post pain: Pain level controlled  Post assessment: Post-op Vital signs reviewed, Patient's Cardiovascular Status Stable, Respiratory Function Stable, Patent Airway and No signs of Nausea or vomiting  Post vital signs: Reviewed and stable  Last Vitals:  Filed Vitals:   07/22/14 0851  BP: 83/41  Pulse:   Temp:   Resp:     Level of consciousness: awake, alert  and patient cooperative  Complications: No apparent anesthesia complications

## 2014-07-22 NOTE — Transfer of Care (Signed)
Immediate Anesthesia Transfer of Care Note  Patient: Laura Graham  Procedure(s) Performed: Procedure(s): COLONOSCOPY WITH PROPOFOL (N/A) POLYPECTOMY  Patient Location: PACU  Anesthesia Type: MAC  Level of Consciousness: awake, alert  and patient cooperative  Airway and Oxygen Therapy: Patient Spontanous Breathing and Patient connected to supplemental oxygen  Post-op Assessment: Post-op Vital signs reviewed, Patient's Cardiovascular Status Stable, Respiratory Function Stable, Patent Airway and No signs of Nausea or vomiting  Post-op Vital Signs: Reviewed and stable  Complications: No apparent anesthesia complications

## 2014-07-22 NOTE — Anesthesia Procedure Notes (Signed)
Procedure Name: MAC Performed by: Amiley Shishido Pre-anesthesia Checklist: Patient identified, Emergency Drugs available, Suction available, Timeout performed and Patient being monitored Patient Re-evaluated:Patient Re-evaluated prior to inductionOxygen Delivery Method: Nasal cannula Placement Confirmation: positive ETCO2       

## 2014-07-22 NOTE — Anesthesia Postprocedure Evaluation (Deleted)
  Anesthesia Post-op Note  Patient: Laura Graham  Procedure(s) Performed: Procedure(s): COLONOSCOPY WITH PROPOFOL (N/A) POLYPECTOMY  Anesthesia type:MAC  Patient location: PACU  Post pain: Pain level controlled  Post assessment: Post-op Vital signs reviewed, Patient's Cardiovascular Status Stable, Respiratory Function Stable, Patent Airway and No signs of Nausea or vomiting  Post vital signs: Reviewed and stable  Last Vitals:  Filed Vitals:   07/22/14 0851  BP: 83/41  Pulse:   Temp:   Resp:     Level of consciousness: awake, alert  and patient cooperative  Complications: No apparent anesthesia complications

## 2014-07-22 NOTE — H&P (Signed)
  [facility]  [address] [place], [place] [address] Phone: [phone] Fax : [phone]  Primary Care Physician:  Geri Seminole, MD Primary Gastroenterologist:  Dr. Allen Norris  Pre-Procedure History & Physical: HPI:  Laura Graham is a 69 y.o. female is here for an colonoscopy.   Past Medical History  Diagnosis Date  . Cancer     colon, breast  . Arthritis     fingers, hands, knees  . Degenerative joint disease     thumbs  . Dental bridge present     Past Surgical History  Procedure Laterality Date  . Tubal ligation    . Colon surgery  2015  . Cholecystectomy    . Tonsillectomy    . Knee surgery Right     Prior to Admission medications   Medication Sig Start Date End Date Taking? Authorizing Provider  calcium citrate (CALCITRATE - DOSED IN MG ELEMENTAL CALCIUM) 950 MG tablet Take 200 mg of elemental calcium by mouth daily.   Yes Historical Provider, MD  cholecalciferol (VITAMIN D) 1000 UNITS tablet Take 1,000 Units by mouth daily.   Yes Historical Provider, MD  glucosamine-chondroitin 500-400 MG tablet Take 1 tablet by mouth daily.   Yes Historical Provider, MD  Na Sulfate-K Sulfate-Mg Sulf (SUPREP BOWEL PREP) SOLN Take 1 kit by mouth as directed. 06/27/14  Yes Lucilla Lame, MD    Allergies as of 06/27/2014  . (No Known Allergies)    History reviewed. No pertinent family history.  History   Social History  . Marital Status: Married    Spouse Name: N/A  . Number of Children: N/A  . Years of Education: N/A   Occupational History  . Not on file.   Social History Main Topics  . Smoking status: Current Every Day Smoker -- 0.50 packs/day for 20 years  . Smokeless tobacco: Not on file  . Alcohol Use: 0.6 oz/week    1 Cans of beer per week  . Drug Use: Not on file  . Sexual Activity: Not on file   Other Topics Concern  . Not on file   Social History Narrative  . No narrative on file    Review of Systems: See HPI, otherwise negative  ROS  Physical Exam: BP 81/45 mmHg  Pulse 59  Temp(Src) 97.7 F (36.5 C) (Temporal)  Resp 16  Ht _0  (1.6 m)  Wt 131 lb (59.421 kg)  BMI 23.21 kg/m2  SpO2 98% General:   Alert,  pleasant and cooperative in NAD Head:  Normocephalic and atraumatic. Neck:  Supple; no masses or thyromegaly. Lungs:  Clear throughout to auscultation.    Heart:  Regular rate and rhythm. Abdomen:  Soft, nontender and nondistended. Normal bowel sounds, without guarding, and without rebound.   Neurologic:  Alert and  oriented x4;  grossly normal neurologically.  Impression/Plan: Laura Graham is here for an colonoscopy to be performed for history of colon cancer  Risks, benefits, limitations, and alternatives regarding  colonoscopy have been reviewed with the patient.  Questions have been answered.  All parties agreeable.   Ollen Bowl, MD  07/22/2014, 8:27 AM

## 2014-07-22 NOTE — Op Note (Signed)
[facility] Patient Name: Laura Graham Procedure Date: 07/22/2014 8:20 AM MRN: [id number] Account #: [id number] Date of Birth: [birth year] Admit Type: Outpatient Age: 69 Room: [facility] OR ROOM 01 Gender: Female Note Status: Finalized Procedure:         Colonoscopy Indications:       High risk colon cancer surveillance: Personal history of                     colon cancer Providers:         Lucilla Lame, MD Medicines:         Propofol per Anesthesia Complications:     No immediate complications. Procedure:         Pre-Anesthesia Assessment:                    - Prior to the procedure, a History and Physical was                     performed, and patient medications and allergies were                     reviewed. The patient's tolerance of previous anesthesia                     was also reviewed. The risks and benefits of the procedure                     and the sedation options and risks were discussed with the                     patient. All questions were answered, and informed consent                     was obtained. Prior Anticoagulants: The patient has taken                     no previous anticoagulant or antiplatelet agents. ASA                     Grade Assessment: II - A patient with mild systemic                     disease. After reviewing the risks and benefits, the                     patient was deemed in satisfactory condition to undergo                     the procedure.                    After obtaining informed consent, the colonoscope was                     passed under direct vision. Throughout the procedure, the                     patient's blood pressure, pulse, and oxygen saturations                     were monitored continuously. The was introduced through                     the anus and advanced to the the cecum, identified by  appendiceal orifice and ileocecal valve. The colonoscopy    was performed without difficulty. The patient tolerated                     the procedure well. The quality of the bowel preparation                     was excellent. Findings:      The perianal and digital rectal examinations were normal.      There was evidence of a prior end-to-side ileo-colonic anastomosis in       the sigmoid colon. This was patent. This was characterized by healthy       appearing mucosa. This was traversed.      Five sessile polyps were found in the sigmoid colon. The polyps were 2       to 4 mm in size. These polyps were removed with a cold snare. Resection       and retrieval were complete.      A few small-mouthed diverticula were found in the sigmoid colon.      A 6 mm polyp was found in the ascending colon. The polyp was sessile.       The polyp was removed with a cold snare. Resection and retrieval were       complete. Impression:        - Patent end-to-side ileo-colonic anastomosis,                     characterized by healthy appearing mucosa.                    - Five 2 to 4 mm polyps in the sigmoid colon. Resected and                     retrieved.                    - Diverticulosis in the sigmoid colon.                    - One 6 mm polyp in the ascending colon. Resected and                     retrieved. Recommendation:    - Await pathology results.                    - Repeat colonoscopy in 3 years for surveillance. Procedure Code(s): --- Professional ---                    [phone], Colonoscopy, flexible; with removal of tumor(s),                     polyp(s), or other lesion(s) by snare technique Diagnosis Code(s): --- Professional ---                    V95.638, Personal history of other malignant neoplasm of                     large intestine                    Z98.0, Intestinal bypass and anastomosis status                    D12.5, Benign neoplasm of sigmoid colon  D12.2, Benign neoplasm of ascending colon CPT copyright  2014 American Medical [employer]. All rights reserved. The codes documented in this report are preliminary and upon coder review may  be revised to meet current compliance requirements. Lucilla Lame, MD 07/22/2014 8:49:28 AM This report has been signed electronically. Number of Addenda: 0 Note Initiated On: 07/22/2014 8:20 AM Scope Withdrawal Time: 0 hours 8 minutes 38 seconds  Total Procedure Duration: 0 hours 14 minutes 36 seconds       [facility]

## 2014-08-09 ENCOUNTER — Encounter: Payer: Self-pay | Admitting: Gastroenterology

## 2014-09-23 ENCOUNTER — Encounter: Payer: Self-pay | Admitting: Obstetrics and Gynecology

## 2014-10-07 ENCOUNTER — Ambulatory Visit
Admission: RE | Admit: 2014-10-07 | Discharge: 2014-10-07 | Disposition: A | Discharge status: Home / Self Care | Payer: Medicare Other | Source: Ambulatory Visit | Attending: Unknown Physician Specialty | Admitting: Unknown Physician Specialty

## 2014-10-07 DIAGNOSIS — Z85038 Personal history of other malignant neoplasm of large intestine: Secondary | ICD-10-CM | POA: Diagnosis not present

## 2014-10-07 DIAGNOSIS — E041 Nontoxic single thyroid nodule: Secondary | ICD-10-CM | POA: Diagnosis present

## 2014-10-07 DIAGNOSIS — Z853 Personal history of malignant neoplasm of breast: Secondary | ICD-10-CM | POA: Insufficient documentation

## 2014-10-20 ENCOUNTER — Other Ambulatory Visit: Payer: Self-pay | Admitting: Unknown Physician Specialty

## 2014-10-20 DIAGNOSIS — E041 Nontoxic single thyroid nodule: Secondary | ICD-10-CM

## 2015-02-21 DIAGNOSIS — E789 Disorder of lipoprotein metabolism, unspecified: Secondary | ICD-10-CM | POA: Insufficient documentation

## 2015-02-21 DIAGNOSIS — Z853 Personal history of malignant neoplasm of breast: Secondary | ICD-10-CM | POA: Insufficient documentation

## 2015-02-21 DIAGNOSIS — E041 Nontoxic single thyroid nodule: Secondary | ICD-10-CM | POA: Insufficient documentation

## 2015-02-21 DIAGNOSIS — Z9889 Other specified postprocedural states: Secondary | ICD-10-CM | POA: Insufficient documentation

## 2015-02-21 DIAGNOSIS — F17210 Nicotine dependence, cigarettes, uncomplicated: Secondary | ICD-10-CM | POA: Insufficient documentation

## 2015-04-24 ENCOUNTER — Other Ambulatory Visit: Payer: Self-pay | Admitting: *Deleted

## 2015-04-24 ENCOUNTER — Ambulatory Visit: Payer: Self-pay

## 2015-04-24 ENCOUNTER — Other Ambulatory Visit: Payer: Self-pay

## 2015-04-24 ENCOUNTER — Inpatient Hospital Stay (HOSPITAL_BASED_OUTPATIENT_CLINIC_OR_DEPARTMENT_OTHER): Payer: Medicare Other | Admitting: Internal Medicine

## 2015-04-24 ENCOUNTER — Inpatient Hospital Stay: Payer: Medicare Other

## 2015-04-24 ENCOUNTER — Inpatient Hospital Stay: Payer: Medicare Other | Attending: Internal Medicine

## 2015-04-24 VITALS — BP 81/51 | HR 62 | Temp 97.3°F | Resp 18 | Wt 128.1 lb

## 2015-04-24 DIAGNOSIS — Z853 Personal history of malignant neoplasm of breast: Secondary | ICD-10-CM | POA: Insufficient documentation

## 2015-04-24 DIAGNOSIS — M1990 Unspecified osteoarthritis, unspecified site: Secondary | ICD-10-CM | POA: Diagnosis not present

## 2015-04-24 DIAGNOSIS — C50911 Malignant neoplasm of unspecified site of right female breast: Secondary | ICD-10-CM

## 2015-04-24 DIAGNOSIS — M129 Arthropathy, unspecified: Secondary | ICD-10-CM | POA: Insufficient documentation

## 2015-04-24 DIAGNOSIS — F17210 Nicotine dependence, cigarettes, uncomplicated: Secondary | ICD-10-CM

## 2015-04-24 DIAGNOSIS — Z79899 Other long term (current) drug therapy: Secondary | ICD-10-CM | POA: Insufficient documentation

## 2015-04-24 DIAGNOSIS — Z85038 Personal history of other malignant neoplasm of large intestine: Secondary | ICD-10-CM | POA: Insufficient documentation

## 2015-04-24 DIAGNOSIS — M818 Other osteoporosis without current pathological fracture: Secondary | ICD-10-CM

## 2015-04-24 DIAGNOSIS — C189 Malignant neoplasm of colon, unspecified: Secondary | ICD-10-CM

## 2015-04-24 DIAGNOSIS — M810 Age-related osteoporosis without current pathological fracture: Secondary | ICD-10-CM

## 2015-04-24 LAB — COMPREHENSIVE METABOLIC PANEL
ALBUMIN: 4.4 g/dL (ref 3.5–5.0)
ALT: 15 U/L (ref 14–54)
ANION GAP: 4 — AB (ref 5–15)
AST: 17 U/L (ref 15–41)
Alkaline Phosphatase: 72 U/L (ref 38–126)
BUN: 15 mg/dL (ref 6–20)
CHLORIDE: 106 mmol/L (ref 101–111)
CO2: 28 mmol/L (ref 22–32)
Calcium: 9.8 mg/dL (ref 8.9–10.3)
Creatinine, Ser: 0.84 mg/dL (ref 0.44–1.00)
GFR calc non Af Amer: >60 mL/min (ref >60)
GLUCOSE: 110 mg/dL — AB (ref 65–99)
POTASSIUM: 4.7 mmol/L (ref 3.5–5.1)
SODIUM: 138 mmol/L (ref 135–145)
Total Bilirubin: 1.1 mg/dL (ref 0.3–1.2)
Total Protein: 7.2 g/dL (ref 6.5–8.1)

## 2015-04-24 LAB — CBC WITH DIFFERENTIAL/PLATELET
BASOS PCT: 1 %
Basophils Absolute: 0 10*3/uL (ref 0–0.1)
EOS ABS: 0.1 10*3/uL (ref 0–0.7)
EOS PCT: 2 %
HCT: 39.6 % (ref 35.0–47.0)
Hemoglobin: 13.6 g/dL (ref 12.0–16.0)
LYMPHS ABS: 1.7 10*3/uL (ref 1.0–3.6)
Lymphocytes Relative: 29 %
MCH: 31.1 pg (ref 26.0–34.0)
MCHC: 34.3 g/dL (ref 32.0–36.0)
MCV: 90.6 fL (ref 80.0–100.0)
MONOS PCT: 8 %
Monocytes Absolute: 0.5 10*3/uL (ref 0.2–0.9)
NEUTROS PCT: 60 %
Neutro Abs: 3.5 10*3/uL (ref 1.4–6.5)
PLATELETS: 195 10*3/uL (ref 150–440)
RBC: 4.37 MIL/uL (ref 3.80–5.20)
RDW: 11.9 % (ref 11.5–14.5)
WBC: 5.8 10*3/uL (ref 3.6–11.0)

## 2015-04-24 MED ORDER — ZOLEDRONIC ACID 5 MG/100ML IV SOLN
5.0000 mg | Freq: Once | INTRAVENOUS | Status: AC
  Administered 2015-04-24: 5 mg via INTRAVENOUS
  Filled 2015-04-24: qty 100

## 2015-04-24 NOTE — Progress Notes (Signed)
Patient here today for routine f/u regarding breast cancer.  Also had colon cancer after breast cancer.  Dr. Pat Patrick performed surgery and patient had clear margins.  No treatment.  Following with Dr. Pat Patrick.

## 2015-04-24 NOTE — Progress Notes (Signed)
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 I reviewed the patient's prior charts/pertinent labs/imaging in detail; findings are summarized above.   70 year old female patient with above history of stage I breast cancer and also stage I colon cancer is here for follow-up. She is on Reclast for osteoporosis.  Patient denies any lumps or bumps. Her appetite is good. No weight loss. No blood in stools. No abdominal pain. No nausea no vomiting. No shortness of breath or cough.   REVIEW OF SYSTEMS:  A complete 10 point review of system is done which is negative except mentioned above/history of present illness.   PAST MEDICAL HISTORY :  Past Medical History  Diagnosis Date  . Cancer     colon, breast  . Arthritis     fingers, hands, knees  . Degenerative joint disease     thumbs  . Dental bridge present     PAST SURGICAL HISTORY :   Past Surgical History  Procedure Laterality Date  . Tubal ligation    . Colon surgery  2015  . Cholecystectomy    . Tonsillectomy    . Knee surgery Right   . Colonoscopy with propofol N/A 07/22/2014    Procedure: COLONOSCOPY WITH PROPOFOL;  Surgeon: Lucilla Lame, MD;  Location: [facility];  Service: Endoscopy;  Laterality: N/A;  . Polypectomy  07/22/2014    Procedure: POLYPECTOMY;  Surgeon: Lucilla Lame, MD;  Location: [facility];  Service: Endoscopy;;    FAMILY HISTORY :  No family history on file.  SOCIAL HISTORY:   Social History  Substance Use Topics  . Smoking status: Current Every Day Smoker -- 0.50  packs/day for 20 years  . Smokeless tobacco: Not on file  . Alcohol Use: 0.6 oz/week    1 Cans of beer per week    ALLERGIES:  has No Known Allergies.  MEDICATIONS:  Current Outpatient Prescriptions  Medication Sig Dispense Refill  . calcium citrate (CALCITRATE - DOSED IN MG ELEMENTAL CALCIUM) 950 MG tablet Take 200 mg of elemental calcium by mouth daily.    . cholecalciferol (VITAMIN D) 1000 UNITS tablet Take 1,000 Units by mouth daily.    Marland Kitchen glucosamine-chondroitin 500-400 MG tablet Take 1 tablet by mouth daily.     No current facility-administered medications for this visit.    PHYSICAL EXAMINATION: ECOG PERFORMANCE STATUS: 0 - Asymptomatic  BP 81/51 mmHg  Pulse 62  Temp(Src) 97.3 F (36.3 C) (Tympanic)  Resp 18  Wt 128 lb 1.4 oz (58.1 kg)  Filed Weights   04/24/15 0915  Weight: 128 lb 1.4 oz (58.1 kg)    GENERAL: Well-nourished well-developed; Alert, no distress and comfortable.  Alone EYES: no pallor or icterus OROPHARYNX: no thrush or ulceration; good dentition  NECK: supple, no masses felt LYMPH:  no palpable lymphadenopathy in the cervical, axillary or inguinal regions LUNGS: clear to auscultation and  No wheeze or crackles HEART/CVS: regular rate & rhythm and no murmurs; No lower extremity edema ABDOMEN:abdomen soft, non-tender and normal bowel sounds Musculoskeletal:no cyanosis of digits and no clubbing  PSYCH: alert & oriented x 3 with fluent speech NEURO: no focal motor/sensory deficits SKIN:  no rashes or significant lesions  LABORATORY DATA:  I have reviewed the data as listed    Component Value Date/Time   NA 138 04/24/2015 0846   NA 139 03/29/2013 0935   K 4.7 04/24/2015 0846   K 4.5 03/29/2013 0935   CL 106 04/24/2015 0846   CL 109* 03/29/2013 0935   CO2 28 04/24/2015 0846   CO2 29 03/29/2013 0935   GLUCOSE 110* 04/24/2015 0846   GLUCOSE 93 03/29/2013 0935   BUN 15 04/24/2015 0846   BUN 14 03/29/2013 0935   CREATININE 0.84 04/24/2015 0846    CREATININE 0.80 04/29/2014 1329   CALCIUM 9.8 04/24/2015 0846   CALCIUM 10.5* 04/29/2014 1329   PROT 7.2 04/24/2015 0846   PROT 7.3 02/25/2013 0948   ALBUMIN 4.4 04/24/2015 0846   ALBUMIN 3.8 02/25/2013 0948   AST 17 04/24/2015 0846   AST 15 02/25/2013 0948   ALT 15 04/24/2015 0846   ALT 24 02/25/2013 0948   ALKPHOS 72 04/24/2015 0846   ALKPHOS 85 02/25/2013 0948   BILITOT 1.1 04/24/2015 0846   BILITOT 0.5 02/25/2013 0948   GFRNONAA >60 04/24/2015 0846   GFRNONAA >60 04/29/2014 1329   GFRNONAA >60 03/11/2011 1054   GFRAA >60 04/24/2015 0846   GFRAA >60 04/29/2014 1329   GFRAA >60 03/11/2011 1054    No results found for: SPEP, UPEP  Lab Results  Component Value Date   WBC 5.8 04/24/2015   NEUTROABS 3.5 04/24/2015   HGB 13.6 04/24/2015   HCT 39.6 04/24/2015   MCV 90.6 04/24/2015   PLT 195 04/24/2015      Chemistry      Component Value Date/Time   NA 138 04/24/2015 0846   NA 139 03/29/2013 0935   K 4.7 04/24/2015 0846   K 4.5 03/29/2013 0935   CL 106 04/24/2015 0846   CL 109* 03/29/2013 0935   CO2 28 04/24/2015 0846   CO2 29 03/29/2013 0935   BUN 15 04/24/2015 0846   BUN 14 03/29/2013 0935   CREATININE 0.84 04/24/2015 0846   CREATININE 0.80 04/29/2014 1329      Component Value Date/Time   CALCIUM 9.8 04/24/2015 0846   CALCIUM 10.5* 04/29/2014 1329   ALKPHOS 72 04/24/2015 0846   ALKPHOS 85 02/25/2013 0948   AST 17 04/24/2015 0846   AST 15 02/25/2013 0948   ALT 15 04/24/2015 0846   ALT 24 02/25/2013 0948   BILITOT 1.1 04/24/2015 0846   BILITOT 0.5 02/25/2013 0948       ASSESSMENT & PLAN:   # RIGHT BREAST CA STAGE I-Low risk oncotype's ; finished AI x 5 years.  clinically no evidence of recurrence. With the mammogram March 2016 negative. She will need a screening bilateral mammogram this year.  # COLON CANCER- STAGE I- 2015  clinically no evidence of recurrence. Continue follow-up.   # Osteopenia/Osteoporosis- Continue Reclast q61m. Tolerating well  without any major side effects.      Cammie Sickle, MD 04/24/2015 4:45 PM

## 2015-04-24 NOTE — Progress Notes (Signed)
Chaperoned provider with Breast Exam 

## 2015-05-01 ENCOUNTER — Other Ambulatory Visit: Payer: Self-pay | Admitting: Internal Medicine

## 2015-05-01 ENCOUNTER — Ambulatory Visit
Admission: RE | Admit: 2015-05-01 | Discharge: 2015-05-01 | Disposition: A | Discharge status: Home / Self Care | Payer: Medicare Other | Source: Ambulatory Visit | Attending: Internal Medicine | Admitting: Internal Medicine

## 2015-05-01 DIAGNOSIS — Z1231 Encounter for screening mammogram for malignant neoplasm of breast: Secondary | ICD-10-CM | POA: Diagnosis not present

## 2015-05-01 DIAGNOSIS — C50911 Malignant neoplasm of unspecified site of right female breast: Secondary | ICD-10-CM

## 2015-05-01 DIAGNOSIS — M810 Age-related osteoporosis without current pathological fracture: Secondary | ICD-10-CM

## 2015-05-01 DIAGNOSIS — C189 Malignant neoplasm of colon, unspecified: Secondary | ICD-10-CM

## 2015-10-09 ENCOUNTER — Ambulatory Visit
Admission: RE | Admit: 2015-10-09 | Discharge: 2015-10-09 | Disposition: A | Discharge status: Home / Self Care | Payer: Medicare Other | Source: Ambulatory Visit | Attending: Unknown Physician Specialty | Admitting: Unknown Physician Specialty

## 2015-10-09 DIAGNOSIS — E041 Nontoxic single thyroid nodule: Secondary | ICD-10-CM | POA: Insufficient documentation

## 2015-10-16 ENCOUNTER — Other Ambulatory Visit: Payer: Self-pay | Admitting: Unknown Physician Specialty

## 2015-10-16 DIAGNOSIS — E041 Nontoxic single thyroid nodule: Secondary | ICD-10-CM

## 2016-02-01 ENCOUNTER — Other Ambulatory Visit (HOSPITAL_BASED_OUTPATIENT_CLINIC_OR_DEPARTMENT_OTHER): Payer: Self-pay | Admitting: Neurosurgery

## 2016-02-01 ENCOUNTER — Ambulatory Visit (HOSPITAL_BASED_OUTPATIENT_CLINIC_OR_DEPARTMENT_OTHER)
Admission: RE | Admit: 2016-02-01 | Discharge: 2016-02-01 | Disposition: A | Discharge status: Home / Self Care | Payer: Medicare Other | Source: Ambulatory Visit | Attending: Neurosurgery | Admitting: Neurosurgery

## 2016-02-01 ENCOUNTER — Ambulatory Visit (HOSPITAL_BASED_OUTPATIENT_CLINIC_OR_DEPARTMENT_OTHER): Payer: Medicare Other

## 2016-02-01 DIAGNOSIS — M545 Low back pain: Secondary | ICD-10-CM

## 2016-02-01 DIAGNOSIS — M5137 Other intervertebral disc degeneration, lumbosacral region: Secondary | ICD-10-CM

## 2016-02-22 ENCOUNTER — Other Ambulatory Visit: Payer: Self-pay | Admitting: Family Medicine

## 2016-02-22 DIAGNOSIS — Z1231 Encounter for screening mammogram for malignant neoplasm of breast: Secondary | ICD-10-CM

## 2016-04-25 ENCOUNTER — Other Ambulatory Visit: Payer: Self-pay

## 2016-04-25 ENCOUNTER — Ambulatory Visit: Payer: Self-pay | Admitting: Oncology

## 2016-04-26 ENCOUNTER — Other Ambulatory Visit (HOSPITAL_BASED_OUTPATIENT_CLINIC_OR_DEPARTMENT_OTHER): Payer: Self-pay

## 2016-05-03 ENCOUNTER — Ambulatory Visit
Admission: RE | Admit: 2016-05-03 | Discharge: 2016-05-03 | Disposition: A | Discharge status: Home / Self Care | Payer: Medicare Other | Source: Ambulatory Visit | Attending: Family Medicine | Admitting: Family Medicine

## 2016-05-03 DIAGNOSIS — Z1231 Encounter for screening mammogram for malignant neoplasm of breast: Secondary | ICD-10-CM | POA: Insufficient documentation

## 2016-05-07 ENCOUNTER — Inpatient Hospital Stay: Payer: Medicare Other | Admitting: *Deleted

## 2016-05-07 ENCOUNTER — Encounter: Payer: Self-pay | Admitting: Oncology

## 2016-05-07 ENCOUNTER — Inpatient Hospital Stay: Payer: Medicare Other | Attending: Oncology | Admitting: Oncology

## 2016-05-07 VITALS — BP 120/73 | HR 55 | Temp 97.3°F | Wt 136.8 lb

## 2016-05-07 DIAGNOSIS — M19042 Primary osteoarthritis, left hand: Secondary | ICD-10-CM | POA: Diagnosis not present

## 2016-05-07 DIAGNOSIS — Z803 Family history of malignant neoplasm of breast: Secondary | ICD-10-CM

## 2016-05-07 DIAGNOSIS — F17210 Nicotine dependence, cigarettes, uncomplicated: Secondary | ICD-10-CM | POA: Diagnosis not present

## 2016-05-07 DIAGNOSIS — Z170 Estrogen receptor positive status [ER+]: Secondary | ICD-10-CM

## 2016-05-07 DIAGNOSIS — M129 Arthropathy, unspecified: Secondary | ICD-10-CM | POA: Diagnosis not present

## 2016-05-07 DIAGNOSIS — M19041 Primary osteoarthritis, right hand: Secondary | ICD-10-CM | POA: Insufficient documentation

## 2016-05-07 DIAGNOSIS — C189 Malignant neoplasm of colon, unspecified: Secondary | ICD-10-CM

## 2016-05-07 DIAGNOSIS — M818 Other osteoporosis without current pathological fracture: Secondary | ICD-10-CM | POA: Diagnosis not present

## 2016-05-07 DIAGNOSIS — D122 Benign neoplasm of ascending colon: Secondary | ICD-10-CM

## 2016-05-07 DIAGNOSIS — Z853 Personal history of malignant neoplasm of breast: Secondary | ICD-10-CM | POA: Diagnosis present

## 2016-05-07 DIAGNOSIS — C50911 Malignant neoplasm of unspecified site of right female breast: Secondary | ICD-10-CM

## 2016-05-07 DIAGNOSIS — M810 Age-related osteoporosis without current pathological fracture: Secondary | ICD-10-CM

## 2016-05-07 DIAGNOSIS — Z85028 Personal history of other malignant neoplasm of stomach: Secondary | ICD-10-CM | POA: Diagnosis not present

## 2016-05-07 LAB — CBC WITH DIFFERENTIAL/PLATELET
Basophils Absolute: 0 10*3/uL (ref 0–0.1)
Basophils Relative: 1 %
Eosinophils Absolute: 0.1 10*3/uL (ref 0–0.7)
Eosinophils Relative: 2 %
HCT: 36.9 % (ref 35.0–47.0)
Hemoglobin: 12.8 g/dL (ref 12.0–16.0)
LYMPHS ABS: 2 10*3/uL (ref 1.0–3.6)
Lymphocytes Relative: 32 %
MCH: 31.4 pg (ref 26.0–34.0)
MCHC: 34.6 g/dL (ref 32.0–36.0)
MCV: 90.6 fL (ref 80.0–100.0)
MONO ABS: 0.5 10*3/uL (ref 0.2–0.9)
Monocytes Relative: 7 %
Neutro Abs: 3.8 10*3/uL (ref 1.4–6.5)
Neutrophils Relative %: 58 %
PLATELETS: 204 10*3/uL (ref 150–440)
RBC: 4.07 MIL/uL (ref 3.80–5.20)
RDW: 12.2 % (ref 11.5–14.5)
WBC: 6.4 10*3/uL (ref 3.6–11.0)

## 2016-05-07 LAB — COMPREHENSIVE METABOLIC PANEL
ALT: 15 U/L (ref 14–54)
AST: 19 U/L (ref 15–41)
Albumin: 3.9 g/dL (ref 3.5–5.0)
Alkaline Phosphatase: 79 U/L (ref 38–126)
Anion gap: 5 (ref 5–15)
BUN: 17 mg/dL (ref 6–20)
CO2: 26 mmol/L (ref 22–32)
CREATININE: 0.72 mg/dL (ref 0.44–1.00)
Calcium: 8.9 mg/dL (ref 8.9–10.3)
Chloride: 107 mmol/L (ref 101–111)
Glucose, Bld: 95 mg/dL (ref 65–99)
POTASSIUM: 4.2 mmol/L (ref 3.5–5.1)
Sodium: 138 mmol/L (ref 135–145)
Total Bilirubin: 0.9 mg/dL (ref 0.3–1.2)
Total Protein: 7.1 g/dL (ref 6.5–8.1)

## 2016-05-08 LAB — CEA: CEA: 2.2 ng/mL (ref 0.0–4.7)

## 2016-05-10 DIAGNOSIS — C50911 Malignant neoplasm of unspecified site of right female breast: Secondary | ICD-10-CM | POA: Insufficient documentation

## 2016-05-10 NOTE — Progress Notes (Signed)
[facility]  Telephone:(336) [phone] Fax:(336) [phone]  ID: AZIE MCCONAHY OB: 1945-09-27  MR#: [id number]  QPY#:[id number]  Patient Care Team: Sherrin Daisy, MD as PCP - General (Family Medicine)  CHIEF COMPLAINT: Pathologic stage IA ER PR positive, HER-2 negative invasive carcinoma of the right breast, unspecified site. Low risk Oncotype.  INTERVAL HISTORY: Patient returns to clinic today for routine yearly evaluation. Her most recent visit is completed by another provider. She currently feels well and is asymptomatic. She has no neurologic complaints. She denies any recent fevers or illnesses. She has a good appetite and denies weight loss. She has no chest pain or shortness of breath. She denies any nausea, vomiting, constipation, or diarrhea. She has no urinary complaints. Patient feels at her baseline and offers no specific complaints today.  REVIEW OF SYSTEMS:   Review of Systems  Constitutional: Negative.  Negative for fever, malaise/fatigue and weight loss.  Respiratory: Negative.  Negative for cough and shortness of breath.   Cardiovascular: Negative.  Negative for chest pain and leg swelling.  Gastrointestinal: Negative.  Negative for abdominal pain.  Genitourinary: Negative.   Musculoskeletal: Negative.   Skin: Negative.  Negative for rash.  Neurological: Negative.  Negative for sensory change and weakness.  Psychiatric/Behavioral: Negative.  The patient is not nervous/anxious.     As per HPI. Otherwise, a complete review of systems is negative.  PAST MEDICAL HISTORY: Past Medical History:  Diagnosis Date  . Arthritis    fingers, hands, knees  . Cancer ([place])    colon, breast  . Degenerative joint disease    thumbs  . Dental bridge present     PAST SURGICAL HISTORY: Past Surgical History:  Procedure Laterality Date  . BREAST BIOPSY Left    neg  . BREAST EXCISIONAL BIOPSY Right 2009   rad no chemo  . CHOLECYSTECTOMY    . COLON SURGERY   2015  . COLONOSCOPY WITH PROPOFOL N/A 07/22/2014   Procedure: COLONOSCOPY WITH PROPOFOL;  Surgeon: Lucilla Lame, MD;  Location: [facility];  Service: Endoscopy;  Laterality: N/A;  . KNEE SURGERY Right   . POLYPECTOMY  07/22/2014   Procedure: POLYPECTOMY;  Surgeon: Lucilla Lame, MD;  Location: [facility];  Service: Endoscopy;;  . TONSILLECTOMY    . TUBAL LIGATION      FAMILY HISTORY: Family History  Problem Relation Age of Onset  . Breast cancer Maternal Aunt   . Breast cancer Maternal Grandmother     ADVANCED DIRECTIVES (Y/N):  N  HEALTH MAINTENANCE: Social History  Substance Use Topics  . Smoking status: Current Every Day Smoker    Packs/day: 0.50    Years: 20.00  . Smokeless tobacco: Never Used  . Alcohol use 0.6 oz/week    1 Cans of beer per week     Colonoscopy:  PAP:  Bone density:  Lipid panel:  No Known Allergies  Current Outpatient Prescriptions  Medication Sig Dispense Refill  . calcium citrate (CALCITRATE - DOSED IN MG ELEMENTAL CALCIUM) 950 MG tablet Take 200 mg of elemental calcium by mouth daily.    . cholecalciferol (VITAMIN D) 1000 UNITS tablet Take 1,000 Units by mouth daily.    Marland Kitchen glucosamine-chondroitin 500-400 MG tablet Take 1 tablet by mouth daily.     No current facility-administered medications for this visit.     OBJECTIVE: Vitals:   05/07/16 1124  BP: 120/73  Pulse: (!) 55  Temp: 97.3 F (36.3 C)     Body mass index is 24.23  kg/m.    ECOG FS:0 - Asymptomatic  General: Well-developed, well-nourished, no acute distress. Eyes: Pink conjunctiva, anicteric sclera. Breasts: Patient requested exam be deferred today. Lungs: Clear to auscultation bilaterally. Heart: Regular rate and rhythm. No rubs, murmurs, or gallops. Abdomen: Soft, nontender, nondistended. No organomegaly noted, normoactive bowel sounds. Musculoskeletal: No edema, cyanosis, or clubbing. Neuro: Alert, answering all questions appropriately. Cranial nerves  grossly intact. Skin: No rashes or petechiae noted. Psych: Normal affect.   LAB RESULTS:  Lab Results  Component Value Date   NA 138 05/07/2016   K 4.2 05/07/2016   CL 107 05/07/2016   CO2 26 05/07/2016   GLUCOSE 95 05/07/2016   BUN 17 05/07/2016   CREATININE 0.72 05/07/2016   CALCIUM 8.9 05/07/2016   PROT 7.1 05/07/2016   ALBUMIN 3.9 05/07/2016   AST 19 05/07/2016   ALT 15 05/07/2016   ALKPHOS 79 05/07/2016   BILITOT 0.9 05/07/2016   GFRNONAA >60 05/07/2016   GFRAA >60 05/07/2016    Lab Results  Component Value Date   WBC 6.4 05/07/2016   NEUTROABS 3.8 05/07/2016   HGB 12.8 05/07/2016   HCT 36.9 05/07/2016   MCV 90.6 05/07/2016   PLT 204 05/07/2016     STUDIES: Mm Screening Breast Tomo Bilateral  Result Date: 05/03/2016 CLINICAL DATA:  Screening. EXAM: 2D DIGITAL SCREENING BILATERAL MAMMOGRAM WITH CAD AND ADJUNCT TOMO COMPARISON:  Previous exam(s). ACR Breast Density Category b: There are scattered areas of fibroglandular density. FINDINGS: There are no findings suspicious for malignancy. Images were processed with CAD. IMPRESSION: No mammographic evidence of malignancy. A result letter of this screening mammogram will be mailed directly to the patient. RECOMMENDATION: Screening mammogram in one year. (Code:SM-B-01Y) BI-RADS CATEGORY  1: Negative. Electronically Signed   By: Ammie Ferrier M.D.   On: 05/03/2016 11:52    ASSESSMENT: Pathologic stage IA ER/PR positive, HER-2 negative invasive carcinoma of the right breast, unspecified site. Low risk Oncotype.  PLAN:    1. Pathologic stage IA ER/PR positive, HER-2 negative invasive carcinoma of the right breast, unspecified site. Low risk Oncotype: Given her low risk Oncotype, patient did not require adjuvant chemotherapy. By report she did not complete adjuvant XRT. She has also completed 5 years of an aromatase inhibitor. Her most recent mammogram on May 03, 2016 was reported as BI-RADS 1, repeat in one year.  Return to clinic in 1 year for further evaluation. 2. Osteoporosis: Will get a bone mineral density in the next 1-2 weeks. Based on these results, will determine if patient needs to continue yearly Reclast injections. It is unclear when her last injection was. 3. History of colon cancer: Given patient's low stage of disease, she does not require chemotherapy. She reports a recent normal colonoscopy. No intervention is needed. Return to clinic as above.  Patient expressed understanding and was in agreement with this plan. She also understands that She can call clinic at any time with any questions, concerns, or complaints.    Lloyd Huger, MD   05/10/2016 2:19 PM

## 2016-06-17 ENCOUNTER — Ambulatory Visit
Admission: RE | Admit: 2016-06-17 | Discharge: 2016-06-17 | Disposition: A | Discharge status: Home / Self Care | Payer: Medicare Other | Source: Ambulatory Visit | Attending: Oncology | Admitting: Oncology

## 2016-06-17 DIAGNOSIS — Z853 Personal history of malignant neoplasm of breast: Secondary | ICD-10-CM | POA: Insufficient documentation

## 2016-06-17 DIAGNOSIS — M810 Age-related osteoporosis without current pathological fracture: Secondary | ICD-10-CM | POA: Diagnosis not present

## 2016-06-17 DIAGNOSIS — D122 Benign neoplasm of ascending colon: Secondary | ICD-10-CM

## 2016-10-15 ENCOUNTER — Ambulatory Visit
Admission: RE | Admit: 2016-10-15 | Discharge: 2016-10-15 | Disposition: A | Discharge status: Home / Self Care | Payer: Medicare Other | Source: Ambulatory Visit | Attending: Unknown Physician Specialty | Admitting: Unknown Physician Specialty

## 2016-10-15 DIAGNOSIS — E041 Nontoxic single thyroid nodule: Secondary | ICD-10-CM

## 2016-10-15 DIAGNOSIS — E042 Nontoxic multinodular goiter: Secondary | ICD-10-CM | POA: Diagnosis not present

## 2016-10-18 ENCOUNTER — Other Ambulatory Visit: Payer: Self-pay | Admitting: Unknown Physician Specialty

## 2016-10-18 DIAGNOSIS — E041 Nontoxic single thyroid nodule: Secondary | ICD-10-CM

## 2017-05-04 NOTE — Progress Notes (Signed)
[facility]  Telephone:(336) [phone] Fax:(336) [phone]  ID: Laura Graham OB: September 19, 1945  MR#: [id number]  FGH#:[id number]  Patient Care Team: Katheren Shams as PCP - General  CHIEF COMPLAINT: Pathologic stage IA ER PR positive, HER-2 negative invasive carcinoma of the right breast, unspecified site. Low risk Oncotype.  INTERVAL HISTORY: Patient returns to clinic today for routine yearly evaluation.  Patient returns to clinic today for routine yearly evaluation.  She continues to feel well and remains asymptomatic. She has no neurologic complaints. She denies any recent fevers or illnesses. She has a good appetite and denies weight loss. She has no chest pain or shortness of breath. She denies any nausea, vomiting, constipation, or diarrhea. She has no urinary complaints.  Patient offers no specific complaints today.  REVIEW OF SYSTEMS:   Review of Systems  Constitutional: Negative.  Negative for fever, malaise/fatigue and weight loss.  Respiratory: Negative.  Negative for cough and shortness of breath.   Cardiovascular: Negative.  Negative for chest pain and leg swelling.  Gastrointestinal: Negative.  Negative for abdominal pain.  Genitourinary: Negative.  Negative for dysuria.  Musculoskeletal: Negative.   Skin: Negative.  Negative for rash.  Neurological: Negative.  Negative for sensory change, focal weakness and weakness.  Psychiatric/Behavioral: Negative.  The patient is not nervous/anxious.     As per HPI. Otherwise, a complete review of systems is negative.  PAST MEDICAL HISTORY: Past Medical History:  Diagnosis Date  . Arthritis    fingers, hands, knees  . Cancer [facility]) 2009   Right breast  . Colon cancer ([place]) 2013  . Degenerative joint disease    thumbs  . Dental bridge present   . Personal history of radiation therapy 2009   Right breast    PAST SURGICAL HISTORY: Past Surgical History:  Procedure Laterality Date  . BREAST BIOPSY Left     neg-  . BREAST EXCISIONAL BIOPSY Right 2009   rad no chemo  . CHOLECYSTECTOMY    . COLON SURGERY  2015  . COLONOSCOPY WITH PROPOFOL N/A 07/22/2014   Procedure: COLONOSCOPY WITH PROPOFOL;  Surgeon: Lucilla Lame, MD;  Location: [facility];  Service: Endoscopy;  Laterality: N/A;  . KNEE SURGERY Right   . POLYPECTOMY  07/22/2014   Procedure: POLYPECTOMY;  Surgeon: Lucilla Lame, MD;  Location: [facility];  Service: Endoscopy;;  . TONSILLECTOMY    . TUBAL LIGATION      FAMILY HISTORY: Family History  Problem Relation Age of Onset  . Breast cancer Maternal Aunt   . Breast cancer Maternal Grandmother     ADVANCED DIRECTIVES (Y/N):  N  HEALTH MAINTENANCE: Social History   Tobacco Use  . Smoking status: Current Every Day Smoker    Packs/day: 0.50    Years: 20.00    Pack years: 10.00  . Smokeless tobacco: Never Used  Substance Use Topics  . Alcohol use: Yes    Alcohol/week: 0.6 oz    Types: 1 Cans of beer per week  . Drug use: Not on file     Colonoscopy:  PAP:  Bone density:  Lipid panel:  No Known Allergies  Current Outpatient Medications  Medication Sig Dispense Refill  . calcium citrate (CALCITRATE - DOSED IN MG ELEMENTAL CALCIUM) 950 MG tablet Take 200 mg of elemental calcium by mouth daily.    . cholecalciferol (VITAMIN D) 1000 UNITS tablet Take 1,000 Units by mouth daily.    Marland Kitchen glucosamine-chondroitin 500-400 MG tablet Take 1 tablet by mouth daily.  No current facility-administered medications for this visit.     OBJECTIVE: Vitals:   05/07/17 1403  BP: 106/61  Pulse: 61  Resp: 18  Temp: (!) 97.3 F (36.3 C)     Body mass index is 23.88 kg/m.    ECOG FS:0 - Asymptomatic  General: Well-developed, well-nourished, no acute distress. Eyes: Pink conjunctiva, anicteric sclera. Breast: Patient had mammogram earlier this week, therefore declined breast exam. Lungs: Clear to auscultation bilaterally. Heart: Regular rate and rhythm. No rubs,  murmurs, or gallops. Abdomen: Soft, nontender, nondistended. No organomegaly noted, normoactive bowel sounds. Musculoskeletal: No edema, cyanosis, or clubbing. Neuro: Alert, answering all questions appropriately. Cranial nerves grossly intact. Skin: No rashes or petechiae noted. Psych: Normal affect.  LAB RESULTS:  Lab Results  Component Value Date   NA 139 05/07/2017   K 5.0 05/07/2017   CL 108 05/07/2017   CO2 24 05/07/2017   GLUCOSE 100 (H) 05/07/2017   BUN 15 05/07/2017   CREATININE 0.75 05/07/2017   CALCIUM 9.6 05/07/2017   PROT 7.0 05/07/2017   ALBUMIN 4.1 05/07/2017   AST 20 05/07/2017   ALT 14 05/07/2017   ALKPHOS 71 05/07/2017   BILITOT 1.0 05/07/2017   GFRNONAA >60 05/07/2017   GFRAA >60 05/07/2017    Lab Results  Component Value Date   WBC 7.1 05/07/2017   NEUTROABS 4.4 05/07/2017   HGB 12.9 05/07/2017   HCT 37.2 05/07/2017   MCV 92.4 05/07/2017   PLT 202 05/07/2017     STUDIES: Mm Screening Breast Tomo Bilateral  Result Date: 05/05/2017 CLINICAL DATA:  Screening. EXAM: DIGITAL SCREENING BILATERAL MAMMOGRAM WITH TOMO AND CAD COMPARISON:  Previous exam(s). ACR Breast Density Category b: There are scattered areas of fibroglandular density. FINDINGS: There are no findings suspicious for malignancy. Images were processed with CAD. IMPRESSION: No mammographic evidence of malignancy. A result letter of this screening mammogram will be mailed directly to the patient. RECOMMENDATION: Screening mammogram in one year. (Code:SM-B-01Y) BI-RADS CATEGORY  1: Negative. Electronically Signed   By: Lajean Manes M.D.   On: 05/05/2017 12:39    ASSESSMENT: Pathologic stage IA ER/PR positive, HER-2 negative invasive carcinoma of the right breast, unspecified site. Low risk Oncotype.  PLAN:    1. Pathologic stage IA ER/PR positive, HER-2 negative invasive carcinoma of the right breast, unspecified site. Low risk Oncotype: Given her low risk Oncotype, patient did not require  adjuvant chemotherapy. By report she did not complete adjuvant XRT. She has also completed 5 years of an aromatase inhibitor.  Her most recent mammogram on May 05, 2017 was reported as BI-RADS 1 repeat in April 2020. Return to clinic in 1 year for further evaluation. 2. Osteoporosis: Patient's most recent bone mineral density on June 17, 2016 reported T score of -2.6.  She last received Reclast approximately 1 year ago, therefore will return to clinic next week for another injection.  Repeat bone marrow density in June 2019.  Return to clinic in 1 year for evaluation and Reclast as above.   3. History of colon cancer: Diagnosed in February 2015.  Status post partial colectomy.  Given her low stage of disease, adjuvant chemotherapy was not necessary. She reports a recent normal colonoscopy. No intervention is needed. Return to clinic as above.  Approximately 30 minutes was spent in discussion of which greater than 50% was consultation.  Patient expressed understanding and was in agreement with this plan. She also understands that She can call clinic at any time with any questions, concerns,  or complaints.    Lloyd Huger, MD   05/10/2017 7:08 AM

## 2017-05-05 ENCOUNTER — Ambulatory Visit
Admission: RE | Admit: 2017-05-05 | Discharge: 2017-05-05 | Disposition: A | Discharge status: Home / Self Care | Payer: Medicare Other | Source: Ambulatory Visit | Attending: Oncology | Admitting: Oncology

## 2017-05-05 DIAGNOSIS — Z1231 Encounter for screening mammogram for malignant neoplasm of breast: Secondary | ICD-10-CM | POA: Insufficient documentation

## 2017-05-05 DIAGNOSIS — D122 Benign neoplasm of ascending colon: Secondary | ICD-10-CM

## 2017-05-05 DIAGNOSIS — Z853 Personal history of malignant neoplasm of breast: Secondary | ICD-10-CM

## 2017-05-05 HISTORY — DX: Malignant neoplasm of colon, unspecified: C18.9

## 2017-05-05 HISTORY — DX: Personal history of irradiation: Z92.3

## 2017-05-07 ENCOUNTER — Inpatient Hospital Stay: Payer: Medicare Other | Attending: Oncology

## 2017-05-07 ENCOUNTER — Inpatient Hospital Stay (HOSPITAL_BASED_OUTPATIENT_CLINIC_OR_DEPARTMENT_OTHER): Payer: Medicare Other | Admitting: Oncology

## 2017-05-07 ENCOUNTER — Other Ambulatory Visit: Payer: Self-pay

## 2017-05-07 VITALS — BP 106/61 | HR 61 | Temp 97.3°F | Resp 18 | Wt 134.8 lb

## 2017-05-07 DIAGNOSIS — M810 Age-related osteoporosis without current pathological fracture: Secondary | ICD-10-CM

## 2017-05-07 DIAGNOSIS — Z85038 Personal history of other malignant neoplasm of large intestine: Secondary | ICD-10-CM | POA: Diagnosis not present

## 2017-05-07 DIAGNOSIS — Z853 Personal history of malignant neoplasm of breast: Secondary | ICD-10-CM

## 2017-05-07 DIAGNOSIS — D122 Benign neoplasm of ascending colon: Secondary | ICD-10-CM

## 2017-05-07 DIAGNOSIS — C50911 Malignant neoplasm of unspecified site of right female breast: Secondary | ICD-10-CM

## 2017-05-07 DIAGNOSIS — Z170 Estrogen receptor positive status [ER+]: Secondary | ICD-10-CM | POA: Diagnosis not present

## 2017-05-07 LAB — CBC WITH DIFFERENTIAL/PLATELET
BASOS ABS: 0 10*3/uL (ref 0–0.1)
BASOS PCT: 1 %
EOS ABS: 0.1 10*3/uL (ref 0–0.7)
Eosinophils Relative: 1 %
HEMATOCRIT: 37.2 % (ref 35.0–47.0)
HEMOGLOBIN: 12.9 g/dL (ref 12.0–16.0)
Lymphocytes Relative: 29 %
Lymphs Abs: 2.1 10*3/uL (ref 1.0–3.6)
MCH: 32.1 pg (ref 26.0–34.0)
MCHC: 34.7 g/dL (ref 32.0–36.0)
MCV: 92.4 fL (ref 80.0–100.0)
MONOS PCT: 7 %
Monocytes Absolute: 0.5 10*3/uL (ref 0.2–0.9)
NEUTROS PCT: 62 %
Neutro Abs: 4.4 10*3/uL (ref 1.4–6.5)
Platelets: 202 10*3/uL (ref 150–440)
RBC: 4.03 MIL/uL (ref 3.80–5.20)
RDW: 12 % (ref 11.5–14.5)
WBC: 7.1 10*3/uL (ref 3.6–11.0)

## 2017-05-07 LAB — COMPREHENSIVE METABOLIC PANEL
ALBUMIN: 4.1 g/dL (ref 3.5–5.0)
ALK PHOS: 71 U/L (ref 38–126)
ALT: 14 U/L (ref 14–54)
ANION GAP: 7 (ref 5–15)
AST: 20 U/L (ref 15–41)
BILIRUBIN TOTAL: 1 mg/dL (ref 0.3–1.2)
BUN: 15 mg/dL (ref 6–20)
CO2: 24 mmol/L (ref 22–32)
Calcium: 9.6 mg/dL (ref 8.9–10.3)
Chloride: 108 mmol/L (ref 101–111)
Creatinine, Ser: 0.75 mg/dL (ref 0.44–1.00)
GFR calc Af Amer: >60 mL/min (ref >60)
GFR calc non Af Amer: >60 mL/min (ref >60)
GLUCOSE: 100 mg/dL — AB (ref 65–99)
Potassium: 5 mmol/L (ref 3.5–5.1)
SODIUM: 139 mmol/L (ref 135–145)
Total Protein: 7 g/dL (ref 6.5–8.1)

## 2017-05-07 NOTE — Progress Notes (Signed)
Here for follow up -per pt " im doing really good " see pain c/o-may be related to mammogram she had Monday per pt

## 2017-05-08 LAB — CEA: CEA: 2.3 ng/mL (ref 0.0–4.7)

## 2017-05-12 ENCOUNTER — Other Ambulatory Visit: Payer: Self-pay | Admitting: *Deleted

## 2017-05-12 NOTE — Progress Notes (Signed)
Entered in error

## 2017-05-15 ENCOUNTER — Other Ambulatory Visit: Payer: Self-pay | Admitting: *Deleted

## 2017-05-15 ENCOUNTER — Other Ambulatory Visit: Payer: Self-pay | Admitting: Oncology

## 2017-05-15 DIAGNOSIS — M810 Age-related osteoporosis without current pathological fracture: Secondary | ICD-10-CM

## 2017-05-15 MED ORDER — ZOLEDRONIC ACID 5 MG/100ML IV SOLN
5.0000 mg | Freq: Once | INTRAVENOUS | Status: DC
Start: 2017-05-16 — End: 2017-05-15

## 2017-05-16 ENCOUNTER — Other Ambulatory Visit: Payer: Self-pay

## 2017-05-16 ENCOUNTER — Inpatient Hospital Stay: Payer: Medicare Other | Attending: Oncology

## 2017-05-16 ENCOUNTER — Inpatient Hospital Stay: Payer: Medicare Other

## 2017-05-16 VITALS — BP 105/61 | HR 51 | Resp 20

## 2017-05-16 DIAGNOSIS — Z170 Estrogen receptor positive status [ER+]: Principal | ICD-10-CM

## 2017-05-16 DIAGNOSIS — M810 Age-related osteoporosis without current pathological fracture: Secondary | ICD-10-CM | POA: Diagnosis not present

## 2017-05-16 DIAGNOSIS — C50911 Malignant neoplasm of unspecified site of right female breast: Secondary | ICD-10-CM

## 2017-05-16 LAB — BASIC METABOLIC PANEL
Anion gap: 5 (ref 5–15)
BUN: 16 mg/dL (ref 6–20)
CHLORIDE: 107 mmol/L (ref 101–111)
CO2: 27 mmol/L (ref 22–32)
CREATININE: 0.88 mg/dL (ref 0.44–1.00)
Calcium: 9.6 mg/dL (ref 8.9–10.3)
GFR calc non Af Amer: >60 mL/min (ref >60)
Glucose, Bld: 98 mg/dL (ref 65–99)
Potassium: 4.9 mmol/L (ref 3.5–5.1)
Sodium: 139 mmol/L (ref 135–145)

## 2017-05-16 MED ORDER — ZOLEDRONIC ACID 5 MG/100ML IV SOLN
5.0000 mg | Freq: Once | INTRAVENOUS | Status: AC
  Administered 2017-05-16: 5 mg via INTRAVENOUS
  Filled 2017-05-16: qty 100

## 2017-06-18 ENCOUNTER — Ambulatory Visit
Admission: RE | Admit: 2017-06-18 | Discharge: 2017-06-18 | Disposition: A | Discharge status: Home / Self Care | Payer: Medicare Other | Source: Ambulatory Visit | Attending: Oncology | Admitting: Oncology

## 2017-06-18 DIAGNOSIS — Z170 Estrogen receptor positive status [ER+]: Secondary | ICD-10-CM | POA: Diagnosis present

## 2017-06-18 DIAGNOSIS — C50911 Malignant neoplasm of unspecified site of right female breast: Secondary | ICD-10-CM | POA: Diagnosis not present

## 2017-06-18 DIAGNOSIS — N959 Unspecified menopausal and perimenopausal disorder: Secondary | ICD-10-CM | POA: Insufficient documentation

## 2017-09-17 ENCOUNTER — Other Ambulatory Visit: Payer: Self-pay

## 2017-09-17 ENCOUNTER — Encounter: Payer: Self-pay | Admitting: *Deleted

## 2017-09-18 NOTE — Discharge Instructions (Signed)

## 2017-09-24 ENCOUNTER — Ambulatory Visit: Payer: Medicare Other | Admitting: Anesthesiology

## 2017-09-24 ENCOUNTER — Encounter
Admission: RE | Disposition: A | Discharge status: Home / Self Care | Payer: Self-pay | Source: Ambulatory Visit | Attending: Ophthalmology

## 2017-09-24 ENCOUNTER — Ambulatory Visit
Admission: RE | Admit: 2017-09-24 | Discharge: 2017-09-24 | Disposition: A | Discharge status: Home / Self Care | Payer: Medicare Other | Source: Ambulatory Visit | Attending: Ophthalmology | Admitting: Ophthalmology

## 2017-09-24 DIAGNOSIS — H2512 Age-related nuclear cataract, left eye: Secondary | ICD-10-CM | POA: Diagnosis not present

## 2017-09-24 DIAGNOSIS — F17200 Nicotine dependence, unspecified, uncomplicated: Secondary | ICD-10-CM | POA: Insufficient documentation

## 2017-09-24 DIAGNOSIS — M1990 Unspecified osteoarthritis, unspecified site: Secondary | ICD-10-CM | POA: Diagnosis not present

## 2017-09-24 DIAGNOSIS — Z79899 Other long term (current) drug therapy: Secondary | ICD-10-CM | POA: Insufficient documentation

## 2017-09-24 DIAGNOSIS — Z85038 Personal history of other malignant neoplasm of large intestine: Secondary | ICD-10-CM | POA: Insufficient documentation

## 2017-09-24 HISTORY — PX: CATARACT EXTRACTION W/PHACO: SHX586

## 2017-09-24 SURGERY — PHACOEMULSIFICATION, CATARACT, WITH IOL INSERTION
Anesthesia: Monitor Anesthesia Care | Site: Eye | Laterality: Left

## 2017-09-24 MED ORDER — BRIMONIDINE TARTRATE-TIMOLOL 0.2-0.5 % OP SOLN
OPHTHALMIC | Status: DC | PRN
  Administered 2017-09-24: 1 [drp] via OPHTHALMIC

## 2017-09-24 MED ORDER — MIDAZOLAM HCL 2 MG/2ML IJ SOLN
INTRAMUSCULAR | Status: DC | PRN
  Administered 2017-09-24: 2 mg via INTRAVENOUS

## 2017-09-24 MED ORDER — FENTANYL CITRATE (PF) 100 MCG/2ML IJ SOLN
INTRAMUSCULAR | Status: DC | PRN
  Administered 2017-09-24: 50 ug via INTRAVENOUS

## 2017-09-24 MED ORDER — MOXIFLOXACIN HCL 0.5 % OP SOLN
1.0000 [drp] | OPHTHALMIC | Status: DC | PRN
  Administered 2017-09-24 (×3): 1 [drp] via OPHTHALMIC

## 2017-09-24 MED ORDER — ONDANSETRON HCL 4 MG/2ML IJ SOLN: 4.0000 mg | Freq: Once | INTRAMUSCULAR | Status: DC | PRN

## 2017-09-24 MED ORDER — LIDOCAINE HCL (PF) 2 % IJ SOLN
INTRAOCULAR | Status: DC | PRN
  Administered 2017-09-24: .5 mL

## 2017-09-24 MED ORDER — CEFUROXIME OPHTHALMIC INJECTION 1 MG/0.1 ML
INJECTION | OPHTHALMIC | Status: DC | PRN
  Administered 2017-09-24: 0.1 mL via OPHTHALMIC

## 2017-09-24 MED ORDER — ARMC OPHTHALMIC DILATING DROPS
1.0000 "application " | OPHTHALMIC | Status: DC | PRN
  Administered 2017-09-24 (×3): 1 via OPHTHALMIC

## 2017-09-24 MED ORDER — LACTATED RINGERS IV SOLN: 10.0000 mL/h | INTRAVENOUS | Status: DC

## 2017-09-24 MED ORDER — EPINEPHRINE PF 1 MG/ML IJ SOLN
INTRAOCULAR | Status: DC | PRN
  Administered 2017-09-24: 75 mL via OPHTHALMIC

## 2017-09-24 MED ORDER — NA HYALUR & NA CHOND-NA HYALUR 0.4-0.35 ML IO KIT
PACK | INTRAOCULAR | Status: DC | PRN
  Administered 2017-09-24: 1 mL via INTRAOCULAR

## 2017-09-24 SURGICAL SUPPLY — 27 items
CANNULA ANT/CHMB 27G (MISCELLANEOUS) ×1 IMPLANT
CANNULA ANT/CHMB 27GA (MISCELLANEOUS) ×3 IMPLANT
CARTRIDGE ABBOTT (MISCELLANEOUS) IMPLANT
GLOVE SURG LX 7.5 STRW (GLOVE) ×2
GLOVE SURG LX STRL 7.5 STRW (GLOVE) ×1 IMPLANT
GLOVE SURG TRIUMPH 8.0 PF LTX (GLOVE) ×3 IMPLANT
GOWN STRL REUS W/ TWL LRG LVL3 (GOWN DISPOSABLE) ×2 IMPLANT
GOWN STRL REUS W/TWL LRG LVL3 (GOWN DISPOSABLE) ×4
LENS IOL TECNIS ITEC 23.0 (Intraocular Lens) ×2 IMPLANT
MARKER SKIN DUAL TIP RULER LAB (MISCELLANEOUS) ×3 IMPLANT
NDL FILTER BLUNT 18X1 1/2 (NEEDLE) ×1 IMPLANT
NDL RETROBULBAR .5 NSTRL (NEEDLE) IMPLANT
NEEDLE FILTER BLUNT 18X 1/2SAF (NEEDLE) ×2
NEEDLE FILTER BLUNT 18X1 1/2 (NEEDLE) ×1 IMPLANT
PACK CATARACT BRASINGTON (MISCELLANEOUS) ×3 IMPLANT
PACK EYE AFTER SURG (MISCELLANEOUS) ×3 IMPLANT
PACK OPTHALMIC (MISCELLANEOUS) ×3 IMPLANT
RING MALYGIN 7.0 (MISCELLANEOUS) IMPLANT
SUT ETHILON 10-0 CS-B-6CS-B-6 (SUTURE)
SUT VICRYL  9 0 (SUTURE)
SUT VICRYL 9 0 (SUTURE) IMPLANT
SUTURE EHLN 10-0 CS-B-6CS-B-6 (SUTURE) IMPLANT
SYR 3ML LL SCALE MARK (SYRINGE) ×3 IMPLANT
SYR 5ML LL (SYRINGE) ×3 IMPLANT
SYR TB 1ML LUER SLIP (SYRINGE) ×3 IMPLANT
WATER STERILE IRR 500ML POUR (IV SOLUTION) ×3 IMPLANT
WIPE NON LINTING 3.25X3.25 (MISCELLANEOUS) ×3 IMPLANT

## 2017-09-24 NOTE — Anesthesia Preprocedure Evaluation (Signed)
Anesthesia Evaluation  Patient identified by MRN, date of birth, ID band Patient awake    Reviewed: Allergy & Precautions, NPO status   Airway Mallampati: II  TM Distance: >3 FB     Dental   Pulmonary Current Smoker,    breath sounds clear to auscultation       Cardiovascular negative cardio ROS   Rhythm:Regular Rate:Normal     Neuro/Psych    GI/Hepatic Hx colon cancer   Endo/Other  negative endocrine ROS  Renal/GU      Musculoskeletal  (+) Arthritis ,   Abdominal   Peds  Hematology   Anesthesia Other Findings   Reproductive/Obstetrics                             Anesthesia Physical Anesthesia Plan  ASA: II  Anesthesia Plan: MAC   Post-op Pain Management:    Induction:   PONV Risk Score and Plan:   Airway Management Planned: Nasal Cannula  Additional Equipment:   Intra-op Plan:   Post-operative Plan:   Informed Consent: I have reviewed the patients History and Physical, chart, labs and discussed the procedure including the risks, benefits and alternatives for the proposed anesthesia with the patient or authorized representative who has indicated his/her understanding and acceptance.     Plan Discussed with: CRNA  Anesthesia Plan Comments:         Anesthesia Quick Evaluation

## 2017-09-24 NOTE — H&P (Signed)
The History and Physical notes are on paper, have been signed, and are to be scanned. The patient remains stable and unchanged from the H&P.   Previous H&P reviewed, patient examined, and there are no changes.  Hayes Rehfeldt 09/24/2017 9:43 AM

## 2017-09-24 NOTE — Anesthesia Postprocedure Evaluation (Signed)
Anesthesia Post Note  Patient: Laura Graham  Procedure(s) Performed: CATARACT EXTRACTION PHACO AND INTRAOCULAR LENS PLACEMENT (IOC)  LEFT (Left Eye)  Patient location during evaluation: PACU Anesthesia Type: MAC Level of consciousness: awake and alert Pain management: pain level controlled Vital Signs Assessment: post-procedure vital signs reviewed and stable Respiratory status: spontaneous breathing, nonlabored ventilation, respiratory function stable and patient connected to nasal cannula oxygen Cardiovascular status: stable and blood pressure returned to baseline Postop Assessment: no apparent nausea or vomiting Anesthetic complications: no    Veda Canning

## 2017-09-24 NOTE — Transfer of Care (Signed)
Immediate Anesthesia Transfer of Care Note  Patient: Laura Graham  Procedure(s) Performed: CATARACT EXTRACTION PHACO AND INTRAOCULAR LENS PLACEMENT (IOC)  LEFT (Left Eye)  Patient Location: PACU  Anesthesia Type: MAC  Level of Consciousness: awake, alert  and patient cooperative  Airway and Oxygen Therapy: Patient Spontanous Breathing and Patient connected to supplemental oxygen  Post-op Assessment: Post-op Vital signs reviewed, Patient's Cardiovascular Status Stable, Respiratory Function Stable, Patent Airway and No signs of Nausea or vomiting  Post-op Vital Signs: Reviewed and stable  Complications: No apparent anesthesia complications

## 2017-09-24 NOTE — Anesthesia Procedure Notes (Signed)
Procedure Name: MAC Date/Time: 09/24/2017 10:42 AM Performed by: Janna Arch, CRNA Pre-anesthesia Checklist: Patient identified, Emergency Drugs available, Suction available, Timeout performed and Patient being monitored Patient Re-evaluated:Patient Re-evaluated prior to induction Oxygen Delivery Method: Nasal cannula Placement Confirmation: positive ETCO2

## 2017-09-24 NOTE — Op Note (Signed)
OPERATIVE NOTE  Laura Graham [id number] 09/24/2017   PREOPERATIVE DIAGNOSIS:  Nuclear sclerotic cataract left eye. H25.12   POSTOPERATIVE DIAGNOSIS:    Nuclear sclerotic cataract left eye.     PROCEDURE:  Phacoemusification with posterior chamber intraocular lens placement of the left eye   LENS:   Implant Name Type Inv. Item Serial No. Manufacturer Lot No. LRB No. Used  LENS IOL DIOP 23.0 - [id number] Intraocular Lens LENS IOL DIOP 23.0 [id number] AMO  Left 1        ULTRASOUND TIME: 17  % of 1 minutes 40 seconds, CDE 16.9  SURGEON:  Wyonia Hough, MD   ANESTHESIA:  Topical with tetracaine drops and 2% Xylocaine jelly, augmented with 1% preservative-free intracameral lidocaine.    COMPLICATIONS:  None.   DESCRIPTION OF PROCEDURE:  The patient was identified in the holding room and transported to the operating room and placed in the supine position under the operating microscope.  The left eye was identified as the operative eye and it was prepped and draped in the usual sterile ophthalmic fashion.   A 1 millimeter clear-corneal paracentesis was made at the 1:30 position.  0.5 ml of preservative-free 1% lidocaine was injected into the anterior chamber.  The anterior chamber was filled with Viscoat viscoelastic.  A 2.4 millimeter keratome was used to make a near-clear corneal incision at the 10:30 position.  .  A curvilinear capsulorrhexis was made with a cystotome and capsulorrhexis forceps.  Balanced salt solution was used to hydrodissect and hydrodelineate the nucleus.   Phacoemulsification was then used in stop and chop fashion to remove the lens nucleus and epinucleus.  The remaining cortex was then removed using the irrigation and aspiration handpiece. Provisc was then placed into the capsular bag to distend it for lens placement.  A lens was then injected into the capsular bag.  The remaining viscoelastic was aspirated.   Wounds were hydrated with balanced salt  solution.  The anterior chamber was inflated to a physiologic pressure with balanced salt solution.  No wound leaks were noted. Cefuroxime 0.1 ml of a 10mg /ml solution was injected into the anterior chamber for a dose of 1 mg of intracameral antibiotic at the completion of the case.   Timolol and Brimonidine drops were applied to the eye.  The patient was taken to the recovery room in stable condition without complications of anesthesia or surgery.  [name] 09/24/2017, 11:02 AM

## 2017-09-25 ENCOUNTER — Encounter: Payer: Self-pay | Admitting: Ophthalmology

## 2017-10-20 ENCOUNTER — Ambulatory Visit
Admission: RE | Admit: 2017-10-20 | Discharge: 2017-10-20 | Disposition: A | Discharge status: Home / Self Care | Payer: Medicare Other | Source: Ambulatory Visit | Attending: Unknown Physician Specialty | Admitting: Unknown Physician Specialty

## 2017-10-20 DIAGNOSIS — E041 Nontoxic single thyroid nodule: Secondary | ICD-10-CM

## 2017-11-12 ENCOUNTER — Other Ambulatory Visit: Payer: Self-pay

## 2017-11-12 ENCOUNTER — Encounter: Payer: Self-pay | Admitting: *Deleted

## 2017-11-12 NOTE — Anesthesia Preprocedure Evaluation (Addendum)
Anesthesia Evaluation  Patient identified by MRN, date of birth, ID band Patient awake    Reviewed: Allergy & Precautions, NPO status , Patient's Chart, lab work & pertinent test results  History of Anesthesia Complications Negative for: history of anesthetic complications  Airway Mallampati: II  TM Distance: >3 FB Neck ROM: Full    Dental  (+) Implants   Pulmonary Current Smoker (1/2 ppd),    Pulmonary exam normal breath sounds clear to auscultation       Cardiovascular Exercise Tolerance: Good negative cardio ROS Normal cardiovascular exam Rhythm:Regular Rate:Normal     Neuro/Psych negative neurological ROS     GI/Hepatic GERD  Controlled,  Endo/Other  negative endocrine ROS  Renal/GU negative Renal ROS     Musculoskeletal  (+) Arthritis , Osteoarthritis,    Abdominal   Peds  Hematology Breast and colon cancers   Anesthesia Other Findings   Reproductive/Obstetrics                            Anesthesia Physical Anesthesia Plan  ASA: II  Anesthesia Plan: MAC   Post-op Pain Management:    Induction: Intravenous  PONV Risk Score and Plan: 1 and TIVA and Midazolam  Airway Management Planned: Natural Airway  Additional Equipment:   Intra-op Plan:   Post-operative Plan:   Informed Consent: I have reviewed the patients History and Physical, chart, labs and discussed the procedure including the risks, benefits and alternatives for the proposed anesthesia with the patient or authorized representative who has indicated his/her understanding and acceptance.     Plan Discussed with: CRNA  Anesthesia Plan Comments:        Anesthesia Quick Evaluation

## 2017-11-13 NOTE — Discharge Instructions (Signed)

## 2017-11-19 ENCOUNTER — Encounter
Admission: RE | Disposition: A | Discharge status: Home / Self Care | Payer: Self-pay | Source: Ambulatory Visit | Attending: Ophthalmology

## 2017-11-19 ENCOUNTER — Ambulatory Visit
Admission: RE | Admit: 2017-11-19 | Discharge: 2017-11-19 | Disposition: A | Discharge status: Home / Self Care | Payer: Medicare Other | Source: Ambulatory Visit | Attending: Ophthalmology | Admitting: Ophthalmology

## 2017-11-19 ENCOUNTER — Ambulatory Visit: Payer: Medicare Other | Admitting: Anesthesiology

## 2017-11-19 DIAGNOSIS — E039 Hypothyroidism, unspecified: Secondary | ICD-10-CM | POA: Diagnosis not present

## 2017-11-19 DIAGNOSIS — Z85038 Personal history of other malignant neoplasm of large intestine: Secondary | ICD-10-CM | POA: Insufficient documentation

## 2017-11-19 DIAGNOSIS — K219 Gastro-esophageal reflux disease without esophagitis: Secondary | ICD-10-CM | POA: Insufficient documentation

## 2017-11-19 DIAGNOSIS — F17210 Nicotine dependence, cigarettes, uncomplicated: Secondary | ICD-10-CM | POA: Insufficient documentation

## 2017-11-19 DIAGNOSIS — Z853 Personal history of malignant neoplasm of breast: Secondary | ICD-10-CM | POA: Insufficient documentation

## 2017-11-19 DIAGNOSIS — H2511 Age-related nuclear cataract, right eye: Secondary | ICD-10-CM | POA: Diagnosis not present

## 2017-11-19 DIAGNOSIS — Z79899 Other long term (current) drug therapy: Secondary | ICD-10-CM | POA: Diagnosis not present

## 2017-11-19 HISTORY — PX: CATARACT EXTRACTION W/PHACO: SHX586

## 2017-11-19 SURGERY — PHACOEMULSIFICATION, CATARACT, WITH IOL INSERTION
Anesthesia: Monitor Anesthesia Care | Site: Eye | Laterality: Right

## 2017-11-19 MED ORDER — FENTANYL CITRATE (PF) 100 MCG/2ML IJ SOLN
INTRAMUSCULAR | Status: DC | PRN
  Administered 2017-11-19: 50 ug via INTRAVENOUS

## 2017-11-19 MED ORDER — NA HYALUR & NA CHOND-NA HYALUR 0.4-0.35 ML IO KIT
PACK | INTRAOCULAR | Status: DC | PRN
  Administered 2017-11-19: 1 mL via INTRAOCULAR

## 2017-11-19 MED ORDER — MIDAZOLAM HCL 2 MG/2ML IJ SOLN
INTRAMUSCULAR | Status: DC | PRN
  Administered 2017-11-19: 2 mg via INTRAVENOUS

## 2017-11-19 MED ORDER — BRIMONIDINE TARTRATE-TIMOLOL 0.2-0.5 % OP SOLN
OPHTHALMIC | Status: DC | PRN
  Administered 2017-11-19: 1 [drp] via OPHTHALMIC

## 2017-11-19 MED ORDER — LIDOCAINE HCL (PF) 2 % IJ SOLN
INTRAOCULAR | Status: DC | PRN
  Administered 2017-11-19: 2 mL

## 2017-11-19 MED ORDER — ACETAMINOPHEN 325 MG PO TABS: 650.0000 mg | ORAL_TABLET | Freq: Once | ORAL | Status: DC | PRN

## 2017-11-19 MED ORDER — TETRACAINE HCL 0.5 % OP SOLN
1.0000 [drp] | OPHTHALMIC | Status: DC | PRN
  Administered 2017-11-19 (×2): 1 [drp] via OPHTHALMIC

## 2017-11-19 MED ORDER — MOXIFLOXACIN HCL 0.5 % OP SOLN
1.0000 [drp] | OPHTHALMIC | Status: DC | PRN
  Administered 2017-11-19 (×3): 1 [drp] via OPHTHALMIC

## 2017-11-19 MED ORDER — ARMC OPHTHALMIC DILATING DROPS
1.0000 "application " | OPHTHALMIC | Status: DC | PRN
  Administered 2017-11-19 (×3): 1 via OPHTHALMIC

## 2017-11-19 MED ORDER — ONDANSETRON HCL 4 MG/2ML IJ SOLN: 4.0000 mg | Freq: Once | INTRAMUSCULAR | Status: DC | PRN

## 2017-11-19 MED ORDER — ACETAMINOPHEN 160 MG/5ML PO SOLN: 325.0000 mg | ORAL | Status: DC | PRN

## 2017-11-19 MED ORDER — LACTATED RINGERS IV SOLN: INTRAVENOUS | Status: DC

## 2017-11-19 MED ORDER — CEFUROXIME OPHTHALMIC INJECTION 1 MG/0.1 ML
INJECTION | OPHTHALMIC | Status: DC | PRN
  Administered 2017-11-19: 0.1 mL via INTRACAMERAL

## 2017-11-19 MED ORDER — EPINEPHRINE PF 1 MG/ML IJ SOLN
INTRAOCULAR | Status: DC | PRN
  Administered 2017-11-19: 69 mL via OPHTHALMIC

## 2017-11-19 SURGICAL SUPPLY — 20 items
CANNULA ANT/CHMB 27G (MISCELLANEOUS) ×1 IMPLANT
CANNULA ANT/CHMB 27GA (MISCELLANEOUS) ×3 IMPLANT
GLOVE SURG LX 7.5 STRW (GLOVE) ×2
GLOVE SURG LX STRL 7.5 STRW (GLOVE) ×1 IMPLANT
GLOVE SURG TRIUMPH 8.0 PF LTX (GLOVE) ×3 IMPLANT
GOWN STRL REUS W/ TWL LRG LVL3 (GOWN DISPOSABLE) ×2 IMPLANT
GOWN STRL REUS W/TWL LRG LVL3 (GOWN DISPOSABLE) ×4
LENS IOL TECNIS ITEC 23.0 (Intraocular Lens) ×2 IMPLANT
MARKER SKIN DUAL TIP RULER LAB (MISCELLANEOUS) ×3 IMPLANT
NDL FILTER BLUNT 18X1 1/2 (NEEDLE) ×1 IMPLANT
NEEDLE FILTER BLUNT 18X 1/2SAF (NEEDLE) ×2
NEEDLE FILTER BLUNT 18X1 1/2 (NEEDLE) ×1 IMPLANT
PACK CATARACT BRASINGTON (MISCELLANEOUS) ×3 IMPLANT
PACK EYE AFTER SURG (MISCELLANEOUS) ×3 IMPLANT
PACK OPTHALMIC (MISCELLANEOUS) ×3 IMPLANT
SYR 3ML LL SCALE MARK (SYRINGE) ×3 IMPLANT
SYR 5ML LL (SYRINGE) ×3 IMPLANT
SYR TB 1ML LUER SLIP (SYRINGE) ×3 IMPLANT
WATER STERILE IRR 500ML POUR (IV SOLUTION) ×3 IMPLANT
WIPE NON LINTING 3.25X3.25 (MISCELLANEOUS) ×3 IMPLANT

## 2017-11-19 NOTE — Anesthesia Postprocedure Evaluation (Signed)
Anesthesia Post Note  Patient: Laura Graham  Procedure(s) Performed: CATARACT EXTRACTION PHACO AND INTRAOCULAR LENS PLACEMENT (IOC) IVA TOPICAL (Right Eye)  Patient location during evaluation: PACU Anesthesia Type: MAC Level of consciousness: awake and alert, oriented and patient cooperative Pain management: pain level controlled Vital Signs Assessment: post-procedure vital signs reviewed and stable Respiratory status: spontaneous breathing, nonlabored ventilation and respiratory function stable Cardiovascular status: blood pressure returned to baseline and stable Postop Assessment: adequate PO intake Anesthetic complications: no    Darrin Nipper

## 2017-11-19 NOTE — Anesthesia Procedure Notes (Signed)
Procedure Name: MAC Performed by: Flannery Cavallero, CRNA Pre-anesthesia Checklist: Patient identified, Emergency Drugs available, Suction available, Timeout performed and Patient being monitored Patient Re-evaluated:Patient Re-evaluated prior to induction Oxygen Delivery Method: Nasal cannula Placement Confirmation: positive ETCO2       

## 2017-11-19 NOTE — Op Note (Signed)
LOCATION:  [place]   PREOPERATIVE DIAGNOSIS:    Nuclear sclerotic cataract right eye. H25.11   POSTOPERATIVE DIAGNOSIS:  Nuclear sclerotic cataract right eye.     PROCEDURE:  Phacoemusification with posterior chamber intraocular lens placement of the right eye   LENS:   Implant Name Type Inv. Item Serial No. Manufacturer Lot No. LRB No. Used  LENS IOL DIOP 23.0 - [id number] Intraocular Lens LENS IOL DIOP 23.0 [id number] AMO  Right 1        ULTRASOUND TIME: 22 % of 1 minutes, 38 seconds.  CDE 21.4   SURGEON:  Wyonia Hough, MD   ANESTHESIA:  Topical with tetracaine drops and 2% Xylocaine jelly, augmented with 1% preservative-free intracameral lidocaine.    COMPLICATIONS:  None.   DESCRIPTION OF PROCEDURE:  The patient was identified in the holding room and transported to the operating room and placed in the supine position under the operating microscope.  The right eye was identified as the operative eye and it was prepped and draped in the usual sterile ophthalmic fashion.   A 1 millimeter clear-corneal paracentesis was made at the 12:00 position.  0.5 ml of preservative-free 1% lidocaine was injected into the anterior chamber. The anterior chamber was filled with Viscoat viscoelastic.  A 2.4 millimeter keratome was used to make a near-clear corneal incision at the 9:00 position.  A curvilinear capsulorrhexis was made with a cystotome and capsulorrhexis forceps.  Balanced salt solution was used to hydrodissect and hydrodelineate the nucleus.   Phacoemulsification was then used in stop and chop fashion to remove the lens nucleus and epinucleus.  The remaining cortex was then removed using the irrigation and aspiration handpiece. Provisc was then placed into the capsular bag to distend it for lens placement.  A lens was then injected into the capsular bag.  The remaining viscoelastic was aspirated.   Wounds were hydrated with balanced salt solution.  The anterior  chamber was inflated to a physiologic pressure with balanced salt solution.  No wound leaks were noted. Cefuroxime 0.1 ml of a 10mg /ml solution was injected into the anterior chamber for a dose of 1 mg of intracameral antibiotic at the completion of the case.   Timolol and Brimonidine drops were applied to the eye.  The patient was taken to the recovery room in stable condition without complications of anesthesia or surgery.   Shayleen Eppinger 11/19/2017, 11:50 AM

## 2017-11-19 NOTE — Transfer of Care (Signed)
Immediate Anesthesia Transfer of Care Note  Patient: Laura Graham  Procedure(s) Performed: CATARACT EXTRACTION PHACO AND INTRAOCULAR LENS PLACEMENT (IOC) IVA TOPICAL (Right Eye)  Patient Location: PACU  Anesthesia Type: MAC  Level of Consciousness: awake, alert  and patient cooperative  Airway and Oxygen Therapy: Patient Spontanous Breathing and Patient connected to supplemental oxygen  Post-op Assessment: Post-op Vital signs reviewed, Patient's Cardiovascular Status Stable, Respiratory Function Stable, Patent Airway and No signs of Nausea or vomiting  Post-op Vital Signs: Reviewed and stable  Complications: No apparent anesthesia complications

## 2017-11-19 NOTE — H&P (Signed)
The History and Physical notes are on paper, have been signed, and are to be scanned. The patient remains stable and unchanged from the H&P.   Previous H&P reviewed, patient examined, and there are no changes.  Vaudine Dutan 11/19/2017 11:01 AM

## 2017-11-20 ENCOUNTER — Encounter: Payer: Self-pay | Admitting: Ophthalmology

## 2018-05-06 ENCOUNTER — Other Ambulatory Visit: Payer: Self-pay

## 2018-05-06 ENCOUNTER — Ambulatory Visit: Payer: Self-pay

## 2018-05-06 ENCOUNTER — Ambulatory Visit: Payer: Self-pay | Admitting: Oncology

## 2018-05-12 ENCOUNTER — Other Ambulatory Visit: Payer: Self-pay

## 2018-05-12 ENCOUNTER — Ambulatory Visit: Payer: Self-pay | Admitting: Oncology

## 2018-05-12 ENCOUNTER — Ambulatory Visit: Payer: Self-pay

## 2018-06-10 ENCOUNTER — Other Ambulatory Visit: Payer: Self-pay | Admitting: Physician Assistant

## 2018-06-10 DIAGNOSIS — M25562 Pain in left knee: Secondary | ICD-10-CM

## 2018-06-18 ENCOUNTER — Other Ambulatory Visit: Payer: Self-pay

## 2018-06-18 ENCOUNTER — Ambulatory Visit
Admission: RE | Admit: 2018-06-18 | Discharge: 2018-06-18 | Disposition: A | Discharge status: Home / Self Care | Payer: Medicare Other | Source: Ambulatory Visit | Attending: Oncology | Admitting: Oncology

## 2018-06-18 DIAGNOSIS — Z1231 Encounter for screening mammogram for malignant neoplasm of breast: Secondary | ICD-10-CM | POA: Diagnosis present

## 2018-06-18 DIAGNOSIS — C50911 Malignant neoplasm of unspecified site of right female breast: Secondary | ICD-10-CM

## 2018-06-18 DIAGNOSIS — Z170 Estrogen receptor positive status [ER+]: Secondary | ICD-10-CM | POA: Insufficient documentation

## 2018-06-21 NOTE — Progress Notes (Signed)
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 Low risk Oncotype.  INTERVAL HISTORY: Patient returns to clinic today for further evaluation and continuation of Prolia.  She continues to feel well and remains asymptomatic. She has no neurologic complaints. She denies any recent fevers or illnesses. She has a good appetite and denies weight loss.  She denies any chest pain, shortness of breath, cough, or hemoptysis.  She denies any nausea, vomiting, constipation, or diarrhea. She has no urinary complaints.  Patient feels at her baseline offers no specific complaints today.  REVIEW OF SYSTEMS:   Review of Systems  Constitutional: Negative.  Negative for fever, malaise/fatigue and weight loss.  Respiratory: Negative.  Negative for cough and shortness of breath.   Cardiovascular: Negative.  Negative for chest pain and leg swelling.  Gastrointestinal: Negative.  Negative for abdominal pain.  Genitourinary: Negative.  Negative for dysuria.  Musculoskeletal: Negative.   Skin: Negative.  Negative for rash.  Neurological: Negative.  Negative for sensory change, focal weakness and weakness.  Psychiatric/Behavioral: Negative.  The patient is not nervous/anxious.     As per HPI. Otherwise, a complete review of systems is negative.  PAST MEDICAL HISTORY: Past Medical History:  Diagnosis Date  . Arthritis    fingers, hands, knees  . Cancer [facility]) 2009   Right breast  . Colon cancer ([place]) 2013  . Degenerative joint disease    thumbs  . Dental bridge present   . Personal history of radiation therapy 2009   Right breast    PAST SURGICAL HISTORY: Past Surgical History:  Procedure Laterality Date   . BREAST BIOPSY Left    neg-  . BREAST EXCISIONAL BIOPSY Right 2009   rad no chemo  . CATARACT EXTRACTION W/PHACO Left 09/24/2017   Procedure: CATARACT EXTRACTION PHACO AND INTRAOCULAR LENS PLACEMENT ([place])  LEFT;  Surgeon: Leandrew Koyanagi, MD;  Location: [place];  Service: Ophthalmology;  Laterality: Left;  . CATARACT EXTRACTION W/PHACO Right 11/19/2017   Procedure: CATARACT EXTRACTION PHACO AND INTRAOCULAR LENS PLACEMENT ([place]) IVA TOPICAL;  Surgeon: Leandrew Koyanagi, MD;  Location: [place];  Service: Ophthalmology;  Laterality: Right;  . CHOLECYSTECTOMY    . COLON SURGERY  2015  . COLONOSCOPY WITH PROPOFOL N/A 07/22/2014   Procedure: COLONOSCOPY WITH PROPOFOL;  Surgeon: Lucilla Lame, MD;  Location: [place];  Service: Endoscopy;  Laterality: N/A;  . KNEE SURGERY Right   . POLYPECTOMY  07/22/2014   Procedure: POLYPECTOMY;  Surgeon: Lucilla Lame, MD;  Location: [place];  Service: Endoscopy;;  . TONSILLECTOMY    . TUBAL LIGATION      FAMILY HISTORY: Family History  Problem Relation Age of Onset  . Breast cancer Maternal Aunt   . Breast cancer Maternal Grandmother     ADVANCED DIRECTIVES (Y/N):  N  HEALTH MAINTENANCE: Social History   Tobacco Use  . Smoking status: Current Every Day Smoker    Packs/day: 0.50    Years: 20.00    Pack years: 10.00  . Smokeless tobacco: Never Used  Substance Use Topics  . Alcohol use: Yes    Alcohol/week: 1.0 standard drinks    Types: 1 Cans of beer per week  . Drug use: Not on file  Colonoscopy:  PAP:  Bone density:  Lipid panel:  No Known Allergies  Current Outpatient Medications  Medication Sig Dispense Refill  . calcium citrate (CALCITRATE - DOSED IN MG ELEMENTAL CALCIUM) 950 MG tablet Take 200 mg of elemental calcium by mouth daily.    . cholecalciferol (VITAMIN D) 1000 UNITS tablet Take 1,000 Units by mouth daily.    Marland Kitchen glucosamine-chondroitin 500-400 MG tablet Take 1 tablet by  mouth daily.    . vitamin B-12 (CYANOCOBALAMIN) 1000 MCG tablet Take 1 tablet by mouth 1 day or 1 dose.     No current facility-administered medications for this visit.     OBJECTIVE: Vitals:   06/26/18 1052  BP: 111/68  Pulse: (!) 56  Temp: 97.9 F (36.6 C)     Body mass index is 23.74 kg/m.    ECOG FS:0 - Asymptomatic  General: Well-developed, well-nourished, no acute distress. Eyes: Pink conjunctiva, anicteric sclera. HEENT: Normocephalic, moist mucous membranes. Lungs: Clear to auscultation bilaterally. Heart: Regular rate and rhythm. No rubs, murmurs, or gallops. Abdomen: Soft, nontender, nondistended. No organomegaly noted, normoactive bowel sounds. Musculoskeletal: No edema, cyanosis, or clubbing. Neuro: Alert, answering all questions appropriately. Cranial nerves grossly intact. Skin: No rashes or petechiae noted. Psych: Normal affect.  LAB RESULTS:  Lab Results  Component Value Date   NA 139 06/26/2018   K 4.9 06/26/2018   CL 107 06/26/2018   CO2 25 06/26/2018   GLUCOSE 105 (H) 06/26/2018   BUN 21 06/26/2018   CREATININE 0.79 06/26/2018   CALCIUM 9.3 06/26/2018   PROT 7.0 06/26/2018   ALBUMIN 4.1 06/26/2018   AST 14 (L) 06/26/2018   ALT 12 06/26/2018   ALKPHOS 71 06/26/2018   BILITOT 0.6 06/26/2018   GFRNONAA >60 06/26/2018   GFRAA >60 06/26/2018    Lab Results  Component Value Date   WBC 7.2 06/26/2018   NEUTROABS 4.5 06/26/2018   HGB 12.6 06/26/2018   HCT 38.7 06/26/2018   MCV 93.7 06/26/2018   PLT 201 06/26/2018     STUDIES: Mr Knee Left Wo Contrast  Result Date: 06/23/2018 CLINICAL DATA:  Left knee pain for 2-3 weeks since the patient felt a pop in her knee while chasing a dog. Initial encounter. EXAM: MRI OF THE LEFT KNEE WITHOUT CONTRAST TECHNIQUE: Multiplanar, multisequence MR imaging of the knee was performed. No intravenous contrast was administered. COMPARISON:  None. FINDINGS: MENISCI Medial meniscus:  Intact. Lateral meniscus: The  anterior horn is not visualized consistent with degenerative maceration. The body is extruded peripherally. LIGAMENTS Cruciates:  Intact.  Mucoid degeneration of the ACL noted. Collaterals:  Intact. CARTILAGE Patellofemoral: Mildly degenerated. The patella rides on the lateral femoral trochlea. Medial:  Mildly degenerated. Lateral:  Moderately degenerated. Joint:  Moderate joint effusion. Popliteal Fossa:  No Baker's cyst. Extensor Mechanism:  Intact. Bones: No fracture, contusion or stress change. There is some subchondral cyst formation in the tibial eminences. Other: Focal subcutaneous fluid anterior to the patella measures 2 cm craniocaudal by 0.7 cm AP by approximately 2 cm transverse. IMPRESSION: Focal prepatellar fluid is compatible with bursitis, possibly hemorrhagic given history of fall. Degenerative maceration anterior horn of the lateral meniscus. The medial meniscus, cruciate and collateral ligaments are intact. No acute bony abnormality. Mild-to-moderate osteoarthritis is worst in the lateral compartment. Electronically Signed   By: Inge Rise M.D.   On: 06/23/2018 08:59   Mm 3d Screen Breast Bilateral  Result Date: 06/18/2018 CLINICAL DATA:  Screening. EXAM: DIGITAL SCREENING BILATERAL MAMMOGRAM WITH TOMO AND  CAD COMPARISON:  Previous exam(s). ACR Breast Density Category b: There are scattered areas of fibroglandular density. FINDINGS: There are no findings suspicious for malignancy. Images were processed with CAD. IMPRESSION: No mammographic evidence of malignancy. A result letter of this screening mammogram will be mailed directly to the patient. RECOMMENDATION: Screening mammogram in one year. (Code:[id number]) BI-RADS CATEGORY  1: Negative. Electronically Signed   By: Lajean Manes M.D.   On: 06/18/2018 13:50    ASSESSMENT: Pathologic stage IA ER/PR positive, HER-2 negative invasive carcinoma of the right breast, unspecified site. Low risk Oncotype.  PLAN:    1. Pathologic stage IA  ER/PR positive, HER-2 negative invasive carcinoma of the right breast, unspecified site. Low risk Oncotype: Given her low risk Oncotype, patient did not require adjuvant chemotherapy. By report she did not complete adjuvant XRT. She has also completed 5 years of an aromatase inhibitor.  Her most recent mammogram on June 18, 2018 was reported as BI-RADS 1.  Repeat in June 2021.   2. Osteoporosis: Patient's most recent bone mineral density on June 18, 2017 reported T score of -1.8.  This is significantly improved over 1 year prior when her T score was ported of -2.6.  Proceed with Prolia today.  Return to clinic in 6 months for laboratory work and Prolia only.  Patient will then return to clinic in 1 year as above.  Repeat bone mineral density in the next 1 to 2 weeks.  If her T score continues to improve, Prolia may be able to be discontinued.   3. History of colon cancer: Diagnosed in February 2015.  Status post partial colectomy.  Given her low stage of disease, adjuvant chemotherapy was not necessary. She reports a recent normal colonoscopy. No intervention is needed. Return to clinic as above.  I spent a total of 30 minutes face-to-face with the patient of which greater than 50% of the visit was spent in counseling and coordination of care as detailed above.   Patient expressed understanding and was in agreement with this plan. She also understands that She can call clinic at any time with any questions, concerns, or complaints.    Lloyd Huger, MD   06/27/2018 8:15 AM

## 2018-06-22 ENCOUNTER — Other Ambulatory Visit: Payer: Self-pay

## 2018-06-22 ENCOUNTER — Other Ambulatory Visit: Payer: Self-pay | Admitting: Physician Assistant

## 2018-06-22 ENCOUNTER — Ambulatory Visit
Admission: RE | Admit: 2018-06-22 | Discharge: 2018-06-22 | Disposition: A | Discharge status: Home / Self Care | Payer: Medicare Other | Source: Ambulatory Visit | Attending: Physician Assistant | Admitting: Physician Assistant

## 2018-06-22 DIAGNOSIS — M25562 Pain in left knee: Secondary | ICD-10-CM | POA: Insufficient documentation

## 2018-06-23 ENCOUNTER — Other Ambulatory Visit: Payer: Self-pay | Admitting: Oncology

## 2018-06-26 ENCOUNTER — Inpatient Hospital Stay: Payer: Medicare Other

## 2018-06-26 ENCOUNTER — Encounter: Payer: Self-pay | Admitting: Oncology

## 2018-06-26 ENCOUNTER — Other Ambulatory Visit: Payer: Self-pay | Admitting: *Deleted

## 2018-06-26 ENCOUNTER — Other Ambulatory Visit: Payer: Self-pay

## 2018-06-26 ENCOUNTER — Inpatient Hospital Stay (HOSPITAL_BASED_OUTPATIENT_CLINIC_OR_DEPARTMENT_OTHER): Payer: Medicare Other | Admitting: Oncology

## 2018-06-26 ENCOUNTER — Inpatient Hospital Stay: Payer: Medicare Other | Attending: Oncology

## 2018-06-26 VITALS — BP 111/68 | HR 56 | Temp 97.9°F | Wt 134.0 lb

## 2018-06-26 DIAGNOSIS — Z79899 Other long term (current) drug therapy: Secondary | ICD-10-CM | POA: Insufficient documentation

## 2018-06-26 DIAGNOSIS — Z85038 Personal history of other malignant neoplasm of large intestine: Secondary | ICD-10-CM | POA: Diagnosis not present

## 2018-06-26 DIAGNOSIS — Z853 Personal history of malignant neoplasm of breast: Secondary | ICD-10-CM | POA: Insufficient documentation

## 2018-06-26 DIAGNOSIS — M810 Age-related osteoporosis without current pathological fracture: Secondary | ICD-10-CM

## 2018-06-26 DIAGNOSIS — Z720 Tobacco use: Secondary | ICD-10-CM

## 2018-06-26 DIAGNOSIS — C50911 Malignant neoplasm of unspecified site of right female breast: Secondary | ICD-10-CM

## 2018-06-26 LAB — CBC WITH DIFFERENTIAL/PLATELET
Abs Immature Granulocytes: 0.02 10*3/uL (ref 0.00–0.07)
Basophils Absolute: 0.1 10*3/uL (ref 0.0–0.1)
Basophils Relative: 1 %
Eosinophils Absolute: 0.1 10*3/uL (ref 0.0–0.5)
Eosinophils Relative: 2 %
HCT: 38.7 % (ref 36.0–46.0)
Hemoglobin: 12.6 g/dL (ref 12.0–15.0)
Immature Granulocytes: 0 %
Lymphocytes Relative: 28 %
Lymphs Abs: 2 10*3/uL (ref 0.7–4.0)
MCH: 30.5 pg (ref 26.0–34.0)
MCHC: 32.6 g/dL (ref 30.0–36.0)
MCV: 93.7 fL (ref 80.0–100.0)
Monocytes Absolute: 0.5 10*3/uL (ref 0.1–1.0)
Monocytes Relative: 7 %
Neutro Abs: 4.5 10*3/uL (ref 1.7–7.7)
Neutrophils Relative %: 62 %
Platelets: 201 10*3/uL (ref 150–400)
RBC: 4.13 MIL/uL (ref 3.87–5.11)
RDW: 11.6 % (ref 11.5–15.5)
WBC: 7.2 10*3/uL (ref 4.0–10.5)
nRBC: 0 % (ref 0.0–0.2)

## 2018-06-26 LAB — COMPREHENSIVE METABOLIC PANEL
ALT: 12 U/L (ref 0–44)
AST: 14 U/L — ABNORMAL LOW (ref 15–41)
Albumin: 4.1 g/dL (ref 3.5–5.0)
Alkaline Phosphatase: 71 U/L (ref 38–126)
Anion gap: 7 (ref 5–15)
BUN: 21 mg/dL (ref 8–23)
CO2: 25 mmol/L (ref 22–32)
Calcium: 9.3 mg/dL (ref 8.9–10.3)
Chloride: 107 mmol/L (ref 98–111)
Creatinine, Ser: 0.79 mg/dL (ref 0.44–1.00)
GFR calc Af Amer: >60 mL/min (ref >60)
GFR calc non Af Amer: >60 mL/min (ref >60)
Glucose, Bld: 105 mg/dL — ABNORMAL HIGH (ref 70–99)
Potassium: 4.9 mmol/L (ref 3.5–5.1)
Sodium: 139 mmol/L (ref 135–145)
Total Bilirubin: 0.6 mg/dL (ref 0.3–1.2)
Total Protein: 7 g/dL (ref 6.5–8.1)

## 2018-06-26 MED ORDER — DENOSUMAB 60 MG/ML ~~LOC~~ SOSY
60.0000 mg | PREFILLED_SYRINGE | Freq: Once | SUBCUTANEOUS | Status: AC
  Administered 2018-06-26: 60 mg via SUBCUTANEOUS
  Filled 2018-06-26: qty 1

## 2018-06-26 NOTE — Progress Notes (Signed)
Patient stated that she had been doing well. She stated that she went running behind her dog and "popped her left knee". Patient is limping and she stated that she will see Dr. Marry Guan.

## 2018-06-27 LAB — CEA: CEA: 2.7 ng/mL (ref 0.0–4.7)

## 2018-07-02 ENCOUNTER — Other Ambulatory Visit: Payer: Self-pay

## 2018-07-02 ENCOUNTER — Ambulatory Visit
Admission: RE | Admit: 2018-07-02 | Discharge: 2018-07-02 | Disposition: A | Discharge status: Home / Self Care | Payer: Medicare Other | Source: Ambulatory Visit | Attending: Oncology | Admitting: Oncology

## 2018-07-02 DIAGNOSIS — C50911 Malignant neoplasm of unspecified site of right female breast: Secondary | ICD-10-CM | POA: Diagnosis not present

## 2018-07-02 DIAGNOSIS — M8589 Other specified disorders of bone density and structure, multiple sites: Secondary | ICD-10-CM | POA: Diagnosis not present

## 2018-07-02 DIAGNOSIS — Z170 Estrogen receptor positive status [ER+]: Secondary | ICD-10-CM | POA: Diagnosis present

## 2018-08-26 ENCOUNTER — Encounter
Admission: RE | Admit: 2018-08-26 | Discharge: 2018-08-26 | Disposition: A | Discharge status: Home / Self Care | Payer: Medicare Other | Source: Ambulatory Visit | Attending: Orthopedic Surgery | Admitting: Orthopedic Surgery

## 2018-08-26 ENCOUNTER — Other Ambulatory Visit: Payer: Self-pay

## 2018-08-26 DIAGNOSIS — R001 Bradycardia, unspecified: Secondary | ICD-10-CM | POA: Insufficient documentation

## 2018-08-26 DIAGNOSIS — Z20828 Contact with and (suspected) exposure to other viral communicable diseases: Secondary | ICD-10-CM | POA: Insufficient documentation

## 2018-08-26 DIAGNOSIS — Z01818 Encounter for other preprocedural examination: Secondary | ICD-10-CM | POA: Insufficient documentation

## 2018-08-26 NOTE — Patient Instructions (Signed)
Your procedure is scheduled on: Wednesday 09/02/18.  Report to DAY SURGERY DEPARTMENT LOCATED ON 2ND FLOOR MEDICAL MALL ENTRANCE. To find out your arrival time please call [phone] between 1PM - 3PM on Tuesday 09/01/18.   Remember: Instructions that are not followed completely may result in serious medical risk, up to and including death, or upon the discretion of your surgeon and anesthesiologist your surgery may need to be rescheduled.      _X__ 1. Do not eat food after midnight the night before your procedure.                 No gum chewing or hard candies. You may drink clear liquids up to 2 hours                 before you are scheduled to arrive for your surgery- DO NOT drink clear                 liquids within 2 hours of the start of your surgery.                 Clear Liquids include:  water, apple juice without pulp, clear carbohydrate                 drink such as Clearfast or Gatorade, Black Coffee or Tea (Do not add                 milk or creamer to coffee or tea).  **Please drink the ENSURE PRE-SURGERY DRINK on the morning of your procedure. Finish the drink 3 hours prior to your arrival time.  __X__2.  On the morning of surgery brush your teeth with toothpaste and water, you may rinse your mouth with mouthwash if you wish.  Do not swallow any toothpaste or mouthwash.      _X__ 3.  No Alcohol for 24 hours before or after surgery.    _X__ 4.  Do Not Smoke or use e-cigarettes For 24 Hours Prior to Your Surgery.                 Do not use any chewable tobacco products for at least 6 hours prior to                 surgery.   __X__5.  Notify your doctor if there is any change in your medical condition      (cold, fever, infections).     Do not wear jewelry, make-up, hairpins, clips or nail polish. Do not wear lotions, powders, or perfumes.  Do not shave 48 hours prior to surgery. Men may shave face and neck. Do not bring valuables to the hospital.     [facility]  is not responsible for any belongings or valuables.    Patients discharged the day of surgery will not be allowed to drive home.     __X__ Take these medicines the morning of surgery with A SIP OF WATER:     1. NONE     __X__ Use CHG Soap as directed   __X__ Stop Anti-inflammatories 7 days before surgery such as Advil, Ibuprofen, Motrin, BC or Goodies Powder, Naprosyn, Naproxen, Aleve, Aspirin, Meloxicam. May take Tylenol if needed for pain or discomfort.    __X__ Don't begin taking any new herbal supplements before your surgery.

## 2018-08-28 ENCOUNTER — Other Ambulatory Visit: Payer: Self-pay

## 2018-08-28 ENCOUNTER — Other Ambulatory Visit
Admission: RE | Admit: 2018-08-28 | Discharge: 2018-08-28 | Disposition: A | Discharge status: Home / Self Care | Payer: Medicare Other | Source: Ambulatory Visit | Attending: Orthopedic Surgery | Admitting: Orthopedic Surgery

## 2018-08-28 DIAGNOSIS — Z01818 Encounter for other preprocedural examination: Secondary | ICD-10-CM | POA: Diagnosis not present

## 2018-08-28 LAB — SARS CORONAVIRUS 2 (TAT 6-24 HRS): SARS Coronavirus 2: NEGATIVE

## 2018-09-02 ENCOUNTER — Ambulatory Visit: Payer: Medicare Other | Admitting: Anesthesiology

## 2018-09-02 ENCOUNTER — Encounter
Admission: RE | Disposition: A | Discharge status: Home / Self Care | Payer: Self-pay | Source: Home / Self Care | Attending: Orthopedic Surgery

## 2018-09-02 ENCOUNTER — Other Ambulatory Visit: Payer: Self-pay

## 2018-09-02 ENCOUNTER — Ambulatory Visit
Admission: RE | Admit: 2018-09-02 | Discharge: 2018-09-02 | Disposition: A | Discharge status: Home / Self Care | Payer: Medicare Other | Attending: Orthopedic Surgery | Admitting: Orthopedic Surgery

## 2018-09-02 ENCOUNTER — Encounter: Payer: Self-pay | Admitting: *Deleted

## 2018-09-02 DIAGNOSIS — Z923 Personal history of irradiation: Secondary | ICD-10-CM | POA: Insufficient documentation

## 2018-09-02 DIAGNOSIS — S83282A Other tear of lateral meniscus, current injury, left knee, initial encounter: Secondary | ICD-10-CM | POA: Diagnosis not present

## 2018-09-02 DIAGNOSIS — M170 Bilateral primary osteoarthritis of knee: Secondary | ICD-10-CM | POA: Diagnosis not present

## 2018-09-02 DIAGNOSIS — Z9889 Other specified postprocedural states: Secondary | ICD-10-CM

## 2018-09-02 DIAGNOSIS — M2392 Unspecified internal derangement of left knee: Secondary | ICD-10-CM | POA: Diagnosis present

## 2018-09-02 DIAGNOSIS — M94262 Chondromalacia, left knee: Secondary | ICD-10-CM | POA: Diagnosis not present

## 2018-09-02 DIAGNOSIS — M25462 Effusion, left knee: Secondary | ICD-10-CM | POA: Diagnosis not present

## 2018-09-02 DIAGNOSIS — Z853 Personal history of malignant neoplasm of breast: Secondary | ICD-10-CM | POA: Diagnosis not present

## 2018-09-02 DIAGNOSIS — Z85038 Personal history of other malignant neoplasm of large intestine: Secondary | ICD-10-CM | POA: Diagnosis not present

## 2018-09-02 DIAGNOSIS — F17200 Nicotine dependence, unspecified, uncomplicated: Secondary | ICD-10-CM | POA: Diagnosis not present

## 2018-09-02 DIAGNOSIS — M19041 Primary osteoarthritis, right hand: Secondary | ICD-10-CM | POA: Diagnosis not present

## 2018-09-02 DIAGNOSIS — X58XXXA Exposure to other specified factors, initial encounter: Secondary | ICD-10-CM | POA: Diagnosis not present

## 2018-09-02 DIAGNOSIS — M19042 Primary osteoarthritis, left hand: Secondary | ICD-10-CM | POA: Diagnosis not present

## 2018-09-02 HISTORY — PX: KNEE ARTHROSCOPY: SHX127

## 2018-09-02 SURGERY — ARTHROSCOPY, KNEE
Anesthesia: General | Laterality: Left

## 2018-09-02 MED ORDER — ACETAMINOPHEN 10 MG/ML IV SOLN
INTRAVENOUS | Status: AC
  Filled 2018-09-02: qty 100

## 2018-09-02 MED ORDER — CELECOXIB 200 MG PO CAPS
ORAL_CAPSULE | ORAL | Status: AC
  Administered 2018-09-02: 15:00:00 400 mg via ORAL
  Filled 2018-09-02: qty 2

## 2018-09-02 MED ORDER — MORPHINE SULFATE 4 MG/ML IJ SOLN
INTRAMUSCULAR | Status: DC | PRN
  Administered 2018-09-02: 4 mg via SUBCUTANEOUS

## 2018-09-02 MED ORDER — ACETAMINOPHEN 10 MG/ML IV SOLN
INTRAVENOUS | Status: DC | PRN
  Administered 2018-09-02: 1000 mg via INTRAVENOUS

## 2018-09-02 MED ORDER — FENTANYL CITRATE (PF) 100 MCG/2ML IJ SOLN
INTRAMUSCULAR | Status: AC
  Filled 2018-09-02: qty 2

## 2018-09-02 MED ORDER — MIDAZOLAM HCL 2 MG/2ML IJ SOLN
INTRAMUSCULAR | Status: AC
  Filled 2018-09-02: qty 2

## 2018-09-02 MED ORDER — BUPIVACAINE-EPINEPHRINE 0.25% -1:200000 IJ SOLN
INTRAMUSCULAR | Status: DC | PRN
  Administered 2018-09-02: 30 mL

## 2018-09-02 MED ORDER — FENTANYL CITRATE (PF) 100 MCG/2ML IJ SOLN
INTRAMUSCULAR | Status: DC | PRN
  Administered 2018-09-02 (×3): 25 ug via INTRAVENOUS
  Administered 2018-09-02: 50 ug via INTRAVENOUS

## 2018-09-02 MED ORDER — ONDANSETRON HCL 4 MG/2ML IJ SOLN
INTRAMUSCULAR | Status: DC | PRN
  Administered 2018-09-02: 4 mg via INTRAVENOUS

## 2018-09-02 MED ORDER — BUPIVACAINE-EPINEPHRINE (PF) 0.5% -1:200000 IJ SOLN
INTRAMUSCULAR | Status: AC
  Filled 2018-09-02: qty 30

## 2018-09-02 MED ORDER — LACTATED RINGERS IV SOLN
INTRAVENOUS | Status: DC
  Administered 2018-09-02: 16:00:00 via INTRAVENOUS

## 2018-09-02 MED ORDER — GLYCOPYRROLATE 0.2 MG/ML IJ SOLN
INTRAMUSCULAR | Status: DC | PRN
  Administered 2018-09-02: 0.2 mg via INTRAVENOUS

## 2018-09-02 MED ORDER — BUPIVACAINE-EPINEPHRINE (PF) 0.25% -1:200000 IJ SOLN
INTRAMUSCULAR | Status: AC
  Filled 2018-09-02: qty 30

## 2018-09-02 MED ORDER — LIDOCAINE HCL (CARDIAC) PF 100 MG/5ML IV SOSY
PREFILLED_SYRINGE | INTRAVENOUS | Status: DC | PRN
  Administered 2018-09-02: 100 mg via INTRAVENOUS
  Administered 2018-09-02: 60 mg via INTRAVENOUS

## 2018-09-02 MED ORDER — FAMOTIDINE 20 MG PO TABS
ORAL_TABLET | ORAL | Status: AC
  Administered 2018-09-02: 15:00:00 20 mg via ORAL
  Filled 2018-09-02: qty 1

## 2018-09-02 MED ORDER — FENTANYL CITRATE (PF) 100 MCG/2ML IJ SOLN: 25.0000 ug | INTRAMUSCULAR | Status: DC | PRN

## 2018-09-02 MED ORDER — DEXAMETHASONE SODIUM PHOSPHATE 10 MG/ML IJ SOLN
INTRAMUSCULAR | Status: DC | PRN
  Administered 2018-09-02: 5 mg via INTRAVENOUS

## 2018-09-02 MED ORDER — PROPOFOL 10 MG/ML IV BOLUS
INTRAVENOUS | Status: DC | PRN
  Administered 2018-09-02: 150 mg via INTRAVENOUS
  Administered 2018-09-02: 20 mg via INTRAVENOUS
  Administered 2018-09-02: 30 mg via INTRAVENOUS

## 2018-09-02 MED ORDER — ONDANSETRON HCL 4 MG/2ML IJ SOLN: 4.0000 mg | Freq: Once | INTRAMUSCULAR | Status: DC | PRN

## 2018-09-02 MED ORDER — DEXAMETHASONE SODIUM PHOSPHATE 10 MG/ML IJ SOLN
INTRAMUSCULAR | Status: AC
  Filled 2018-09-02: qty 1

## 2018-09-02 MED ORDER — HYDROCODONE-ACETAMINOPHEN 5-325 MG PO TABS
ORAL_TABLET | ORAL | Status: AC
  Filled 2018-09-02: qty 1

## 2018-09-02 MED ORDER — CHLORHEXIDINE GLUCONATE 4 % EX LIQD: 60.0000 mL | Freq: Once | CUTANEOUS | Status: DC

## 2018-09-02 MED ORDER — EPHEDRINE SULFATE 50 MG/ML IJ SOLN
INTRAMUSCULAR | Status: DC | PRN
  Administered 2018-09-02 (×3): 10 mg via INTRAVENOUS
  Administered 2018-09-02: 5 mg via INTRAVENOUS
  Administered 2018-09-02: 10 mg via INTRAVENOUS
  Administered 2018-09-02: 5 mg via INTRAVENOUS
  Administered 2018-09-02: 10 mg via INTRAVENOUS

## 2018-09-02 MED ORDER — ONDANSETRON HCL 4 MG/2ML IJ SOLN
INTRAMUSCULAR | Status: AC
  Filled 2018-09-02: qty 2

## 2018-09-02 MED ORDER — MORPHINE SULFATE (PF) 4 MG/ML IV SOLN
INTRAVENOUS | Status: AC
  Filled 2018-09-02: qty 1

## 2018-09-02 MED ORDER — MIDAZOLAM HCL 2 MG/2ML IJ SOLN
INTRAMUSCULAR | Status: DC | PRN
  Administered 2018-09-02: 2 mg via INTRAVENOUS

## 2018-09-02 MED ORDER — CELECOXIB 200 MG PO CAPS
400.0000 mg | ORAL_CAPSULE | Freq: Once | ORAL | Status: AC
  Administered 2018-09-02: 15:00:00 400 mg via ORAL

## 2018-09-02 MED ORDER — PROPOFOL 10 MG/ML IV BOLUS
INTRAVENOUS | Status: AC
  Filled 2018-09-02: qty 20

## 2018-09-02 MED ORDER — HYDROCODONE-ACETAMINOPHEN 5-325 MG PO TABS: 1.0000 | ORAL_TABLET | ORAL | 0 refills | Status: DC | PRN

## 2018-09-02 MED ORDER — HYDROCODONE-ACETAMINOPHEN 5-325 MG PO TABS
1.0000 | ORAL_TABLET | Freq: Once | ORAL | Status: AC
  Administered 2018-09-02: 19:00:00 1 via ORAL

## 2018-09-02 MED ORDER — FAMOTIDINE 20 MG PO TABS
20.0000 mg | ORAL_TABLET | Freq: Once | ORAL | Status: AC
  Administered 2018-09-02: 15:00:00 20 mg via ORAL

## 2018-09-02 SURGICAL SUPPLY — 26 items
BLADE SHAVER 4.5 DBL SERAT CV (CUTTER) IMPLANT
COVER WAND RF STERILE (DRAPES) ×3 IMPLANT
CUFF TOURN SGL QUICK 24 (TOURNIQUET CUFF)
CUFF TOURN SGL QUICK 30 (TOURNIQUET CUFF)
CUFF TRNQT CYL 24X4X16.5-23 (TOURNIQUET CUFF) IMPLANT
CUFF TRNQT CYL 30X4X21-28X (TOURNIQUET CUFF) IMPLANT
DRSG DERMACEA 8X12 NADH (GAUZE/BANDAGES/DRESSINGS) ×3 IMPLANT
DURAPREP 26ML APPLICATOR (WOUND CARE) ×6 IMPLANT
GAUZE SPONGE 4X4 12PLY STRL (GAUZE/BANDAGES/DRESSINGS) ×3 IMPLANT
GLOVE BIOGEL M STRL SZ7.5 (GLOVE) ×3 IMPLANT
GLOVE INDICATOR 8.0 STRL GRN (GLOVE) ×3 IMPLANT
GOWN STRL REUS W/ TWL LRG LVL3 (GOWN DISPOSABLE) ×2 IMPLANT
GOWN STRL REUS W/TWL LRG LVL3 (GOWN DISPOSABLE) ×4
IV LACTATED RINGER IRRG 3000ML (IV SOLUTION) ×12
IV LR IRRIG 3000ML ARTHROMATIC (IV SOLUTION) ×6 IMPLANT
KIT TURNOVER KIT A (KITS) ×3 IMPLANT
MANIFOLD NEPTUNE II (INSTRUMENTS) ×3 IMPLANT
PACK ARTHROSCOPY KNEE (MISCELLANEOUS) ×3 IMPLANT
PACK TOTAL KNEE (MISCELLANEOUS) IMPLANT
SET TUBE SUCT SHAVER OUTFL 24K (TUBING) ×3 IMPLANT
SET TUBE TIP INTRA-ARTICULAR (MISCELLANEOUS) ×3 IMPLANT
SUT ETHILON 3-0 FS-10 30 BLK (SUTURE) ×3
SUTURE EHLN 3-0 FS-10 30 BLK (SUTURE) ×1 IMPLANT
TUBING ARTHRO INFLOW-ONLY STRL (TUBING) ×3 IMPLANT
WAND HAND CNTRL MULTIVAC 50 (MISCELLANEOUS) ×3 IMPLANT
WRAP KNEE W/COLD PACKS 25.5X14 (SOFTGOODS) ×3 IMPLANT

## 2018-09-02 NOTE — Anesthesia Post-op Follow-up Note (Signed)
Anesthesia QCDR form completed.        

## 2018-09-02 NOTE — Anesthesia Procedure Notes (Signed)
Procedure Name: LMA Insertion Date/Time: 09/02/2018 4:30 PM Performed by: Johnna Acosta, CRNA Pre-anesthesia Checklist: Patient identified, Emergency Drugs available, Suction available, Patient being monitored and Timeout performed Patient Re-evaluated:Patient Re-evaluated prior to induction Oxygen Delivery Method: Circle system utilized Preoxygenation: Pre-oxygenation with 100% oxygen Induction Type: IV induction LMA: LMA flexible inserted LMA Size: 3.5 Tube type: Oral Number of attempts: 1 Placement Confirmation: positive ETCO2 and breath sounds checked- equal and bilateral Tube secured with: Tape Dental Injury: Teeth and Oropharynx as per pre-operative assessment

## 2018-09-02 NOTE — Discharge Instructions (Signed)
AMBULATORY SURGERY  DISCHARGE INSTRUCTIONS   1) The drugs that you were given will stay in your system until tomorrow so for the next 24 hours you should not:  A) Drive an automobile B) Make any legal decisions C) Drink any alcoholic beverage   2) You may resume regular meals tomorrow.  Today it is better to start with liquids and gradually work up to solid foods.  You may eat anything you prefer, but it is better to start with liquids, then soup and crackers, and gradually work up to solid foods.   3) Please notify your doctor immediately if you have any unusual bleeding, trouble breathing, redness and pain at the surgery site, drainage, fever, or pain not relieved by medication.    4) Additional Instructions:        Please contact your physician with any problems or Same Day Surgery at [phone], Monday through Friday 6 am to 4 pm, or [place] at [facility] number at [phone]. Instructions after Knee Arthroscopy    James P. Holley Bouche., M.D.     Dept. of [facility]  [place] [place], [place]  [address]   Phone: [phone]   Fax: [phone]   DIET:  Drink plenty of non-alcoholic fluids & begin a light diet.  Resume your normal diet the day after surgery.  ACTIVITY:   You may use crutches or a walker with weight-bearing as tolerated, unless instructed otherwise.  You may wean yourself off of the walker or crutches as tolerated.   Begin doing gentle exercises. Exercising will reduce the pain and swelling, increase motion, and prevent muscle weakness.    Avoid strenuous activities or athletics for a minimum of 4-6 weeks after arthroscopic surgery.  Do not drive or operate any equipment until instructed.  WOUND CARE:   Place one to two pillows under the knee the first day or two when sitting or lying.   Continue to use the ice packs periodically to reduce pain and swelling.  The small incisions  in your knee are closed with nylon stitches. The stitches will be removed in the office.  The bulky dressing may be removed on the second day after surgery. DO NOT TOUCH THE STITCHES. Put a Band-Aid over each stitch. Do NOT use any ointments or creams on the incisions.   You may bathe or shower after the stitches are removed at the first office visit following surgery.  MEDICATIONS:  You may resume your regular medications.  Please take the pain medication as prescribed.  Do not take pain medication on an empty stomach.  Do not drive or drink alcoholic beverages when taking pain medications.  CALL THE OFFICE FOR:  Temperature above 101 degrees  Excessive bleeding or drainage on the dressing.  Excessive swelling, coldness, or paleness of the toes.  Persistent nausea and vomiting.  FOLLOW-UP:   You should have an appointment to return to the office in 7-10 days after surgery.

## 2018-09-02 NOTE — H&P (Signed)
The patient has been re-examined, and the chart reviewed, and there have been no interval changes to the documented history and physical.    The risks, benefits, and alternatives have been discussed at length. The patient expressed understanding of the risks benefits and agreed with plans for surgical intervention.  James P. Hooten, Jr. M.D.    

## 2018-09-02 NOTE — Transfer of Care (Signed)
Immediate Anesthesia Transfer of Care Note  Patient: Laura Graham  Procedure(s) Performed: LEFT ARTHROSCOPY KNEE MENISETOMY AND CHONDROPLASTY (Left )  Patient Location: PACU  Anesthesia Type:General  Level of Consciousness: sedated  Airway & Oxygen Therapy: Patient Spontanous Breathing and Patient connected to face mask oxygen  Post-op Assessment: Report given to RN and Post -op Vital signs reviewed and stable  Post vital signs: Reviewed and stable  Last Vitals:  Vitals Value Taken Time  BP 104/46 09/02/18 1756  Temp    Pulse 62 09/02/18 1759  Resp 12 09/02/18 1759  SpO2 99 % 09/02/18 1759  Vitals shown include unvalidated device data.  Last Pain:  Vitals:   09/02/18 1434  TempSrc: Oral  PainSc: 0-No pain         Complications: No apparent anesthesia complications

## 2018-09-02 NOTE — Anesthesia Preprocedure Evaluation (Signed)
Anesthesia Evaluation  Patient identified by MRN, date of birth, ID band Patient awake    Reviewed: Allergy & Precautions, H&P , NPO status , Patient's Chart, lab work & pertinent test results, reviewed documented beta blocker date and time   History of Anesthesia Complications Negative for: history of anesthetic complications  Airway Mallampati: I  TM Distance: >3 FB Neck ROM: full    Dental  (+) Dental Advidsory Given, Teeth Intact, Missing Permanent bridge on the top:   Pulmonary neg shortness of breath, neg COPD, neg recent URI, Current Smoker and Patient abstained from smoking.,    Pulmonary exam normal        Cardiovascular Exercise Tolerance: Good negative cardio ROS Normal cardiovascular exam     Neuro/Psych negative neurological ROS  negative psych ROS   GI/Hepatic Neg liver ROS, GERD  ,  Endo/Other  negative endocrine ROS  Renal/GU negative Renal ROS  negative genitourinary   Musculoskeletal   Abdominal   Peds  Hematology negative hematology ROS (+)   Anesthesia Other Findings Past Medical History: No date: Arthritis     Comment:  fingers, hands, knees 2009: Cancer ([place])     Comment:  Right breast 2013: Colon cancer ([place]) No date: Degenerative joint disease     Comment:  thumbs No date: Dental bridge present 2009: Personal history of radiation therapy     Comment:  Right breast   Reproductive/Obstetrics negative OB ROS                             Anesthesia Physical Anesthesia Plan  ASA: II  Anesthesia Plan: General   Post-op Pain Management:    Induction: Intravenous  PONV Risk Score and Plan: Ondansetron, Dexamethasone and Treatment may vary due to age or medical condition  Airway Management Planned: LMA  Additional Equipment:   Intra-op Plan:   Post-operative Plan: Extubation in OR  Informed Consent: I have reviewed the patients History and Physical,  chart, labs and discussed the procedure including the risks, benefits and alternatives for the proposed anesthesia with the patient or authorized representative who has indicated his/her understanding and acceptance.     Dental Advisory Given  Plan Discussed with: Anesthesiologist, CRNA and Surgeon  Anesthesia Plan Comments:         Anesthesia Quick Evaluation

## 2018-09-02 NOTE — Op Note (Signed)
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   ASSISTANT: none  ANESTHESIA: general  ESTIMATED BLOOD LOSS: Minimal  FLUIDS REPLACED: 700 mL of crystalloid  TOURNIQUET TIME: Not used  INDICATIONS FOR SURGERY: Laura Graham is a 73 y.o. year old female who has been seen for complaints of left knee pain. MRI demonstrated findings consistent with meniscal pathology. After discussion of the risks and benefits of surgical intervention, the patient expressed understanding of the risks benefits and agree with plans for left knee arthroscopy.   PROCEDURE IN DETAIL: The patient was brought into the operating room and, after adequate general anesthesia was achieved, a tourniquet was applied to the left thigh and the leg was placed in the leg holder. All bony prominences were well padded. The patient's left knee was cleaned and prepped with alcohol and Duraprep and draped in the usual sterile fashion. A "timeout" was performed as per usual protocol. The anticipated portal sites were injected with 0.25% Marcaine with epinephrine. An anterolateral incision was made and a cannula was inserted. A large effusion was evacuated and the knee was distended with fluid using the pump. The scope was advanced down the medial gutter into the medial compartment. Under visualization with the scope, an anteromedial portal was created and a hooked probe was inserted. The medial meniscus was visualized and probed.  The medial meniscus was intact without evidence of tear or instability.  The  articular cartilage was visualized.  There was a focal area of grade 2 to early grade III chondromalacia involving the medial femoral condyle bone.  The area was debrided and contoured using a 50 degree ArthroCare wand.  The scope was then advanced into the intercondylar notch. The anterior cruciate ligament was visualized and probed and felt to be intact. The scope was removed from the lateral portal and reinserted via the anteromedial portal to better visualize the lateral compartment. The lateral meniscus was visualized and probed.  There was a complex tear of the anterior horn of the lateral meniscus.  The tear was debrided using a 4.5 mm incisor shaver and then debrided and contoured using a 50 degree ArthroCare wand.  The remaining rim of meniscus was visualized and probed and felt be stable.  Most of the anterior horn was resected.  The articular cartilage of the lateral compartment was visualized.  There were grade 3 changes of chondromalacia to the lateral femoral condyle.  These areas were contoured using the 50 degree ArthroCare wand.  Finally, the scope was advanced so as to visualize the patellofemoral articulation. Good patellar tracking was appreciated.  The articular surface was in good condition.  The knee was irrigated with copius amounts of fluid and suctioned dry. The anterolateral portal was re-approximated with #3-0 nylon. A combination of 0.25% Marcaine with epinephrine and 4 mg of Morphine were injected via the scope. The scope was removed and the anteromedial portal was re-approximated with #3-0 nylon. A sterile dressing was applied followed by application of an ice wrap.  The patient tolerated the procedure well and was transported to the PACU  in stable condition.  James P. Holley Bouche., M.D.

## 2018-09-02 NOTE — Anesthesia Postprocedure Evaluation (Signed)
Anesthesia Post Note  Patient: Laura Graham  Procedure(s) Performed: LEFT ARTHROSCOPY KNEE MENISETOMY AND CHONDROPLASTY (Left )  Patient location during evaluation: PACU Anesthesia Type: General Level of consciousness: awake and alert Pain management: pain level controlled Vital Signs Assessment: post-procedure vital signs reviewed and stable Respiratory status: spontaneous breathing, nonlabored ventilation and respiratory function stable Cardiovascular status: blood pressure returned to baseline and stable Postop Assessment: no apparent nausea or vomiting Anesthetic complications: no     Last Vitals:  Vitals:   09/02/18 1854 09/02/18 1914  BP: 131/69 132/65  Pulse: 61 (!) 58  Resp: 17 18  Temp: 36.8 C   SpO2: 98% 97%    Last Pain:  Vitals:   09/02/18 1914  TempSrc:   PainSc: [place]

## 2018-12-28 ENCOUNTER — Inpatient Hospital Stay: Payer: Medicare Other

## 2018-12-28 ENCOUNTER — Inpatient Hospital Stay: Payer: Medicare Other | Attending: Oncology

## 2018-12-28 ENCOUNTER — Other Ambulatory Visit: Payer: Self-pay

## 2018-12-28 DIAGNOSIS — Z853 Personal history of malignant neoplasm of breast: Secondary | ICD-10-CM | POA: Diagnosis not present

## 2018-12-28 DIAGNOSIS — M810 Age-related osteoporosis without current pathological fracture: Secondary | ICD-10-CM | POA: Diagnosis not present

## 2018-12-28 DIAGNOSIS — Z170 Estrogen receptor positive status [ER+]: Secondary | ICD-10-CM

## 2018-12-28 LAB — CBC WITH DIFFERENTIAL/PLATELET
Abs Immature Granulocytes: 0.03 10*3/uL (ref 0.00–0.07)
Basophils Absolute: 0 10*3/uL (ref 0.0–0.1)
Basophils Relative: 1 %
Eosinophils Absolute: 0.1 10*3/uL (ref 0.0–0.5)
Eosinophils Relative: 1 %
HCT: 38.7 % (ref 36.0–46.0)
Hemoglobin: 12.5 g/dL (ref 12.0–15.0)
Immature Granulocytes: 0 %
Lymphocytes Relative: 27 %
Lymphs Abs: 1.9 10*3/uL (ref 0.7–4.0)
MCH: 30.8 pg (ref 26.0–34.0)
MCHC: 32.3 g/dL (ref 30.0–36.0)
MCV: 95.3 fL (ref 80.0–100.0)
Monocytes Absolute: 0.4 10*3/uL (ref 0.1–1.0)
Monocytes Relative: 6 %
Neutro Abs: 4.8 10*3/uL (ref 1.7–7.7)
Neutrophils Relative %: 65 %
Platelets: 224 10*3/uL (ref 150–400)
RBC: 4.06 MIL/uL (ref 3.87–5.11)
RDW: 11.4 % — ABNORMAL LOW (ref 11.5–15.5)
WBC: 7.3 10*3/uL (ref 4.0–10.5)
nRBC: 0 % (ref 0.0–0.2)

## 2018-12-28 LAB — COMPREHENSIVE METABOLIC PANEL
ALT: 18 U/L (ref 0–44)
AST: 17 U/L (ref 15–41)
Albumin: 4.2 g/dL (ref 3.5–5.0)
Alkaline Phosphatase: 63 U/L (ref 38–126)
Anion gap: 7 (ref 5–15)
BUN: 19 mg/dL (ref 8–23)
CO2: 26 mmol/L (ref 22–32)
Calcium: 9.6 mg/dL (ref 8.9–10.3)
Chloride: 106 mmol/L (ref 98–111)
Creatinine, Ser: 0.88 mg/dL (ref 0.44–1.00)
GFR calc Af Amer: >60 mL/min (ref >60)
GFR calc non Af Amer: >60 mL/min (ref >60)
Glucose, Bld: 103 mg/dL — ABNORMAL HIGH (ref 70–99)
Potassium: 4.7 mmol/L (ref 3.5–5.1)
Sodium: 139 mmol/L (ref 135–145)
Total Bilirubin: 0.9 mg/dL (ref 0.3–1.2)
Total Protein: 7.3 g/dL (ref 6.5–8.1)

## 2018-12-28 MED ORDER — DENOSUMAB 60 MG/ML ~~LOC~~ SOSY
60.0000 mg | PREFILLED_SYRINGE | Freq: Once | SUBCUTANEOUS | Status: AC
  Administered 2018-12-28: 60 mg via SUBCUTANEOUS
  Filled 2018-12-28: qty 1

## 2018-12-29 LAB — CEA: CEA: 2.5 ng/mL (ref 0.0–4.7)

## 2019-06-29 NOTE — Progress Notes (Signed)
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 Low risk Oncotype.  INTERVAL HISTORY: Patient returns to clinic today for routine yearly evaluation and continuation of Prolia.  She currently feels well and is asymptomatic. She has no neurologic complaints. She denies any recent fevers or illnesses. She has a good appetite and denies weight loss.  She denies any chest pain, shortness of breath, cough, or hemoptysis.  She denies any nausea, vomiting, constipation, or diarrhea. She has no urinary complaints.  Patient offers no specific complaints today.    REVIEW OF SYSTEMS:   Review of Systems  Constitutional: Negative.  Negative for fever, malaise/fatigue and weight loss.  Respiratory: Negative.  Negative for cough and shortness of breath.   Cardiovascular: Negative.  Negative for chest pain and leg swelling.  Gastrointestinal: Negative.  Negative for abdominal pain.  Genitourinary: Negative.  Negative for dysuria.  Musculoskeletal: Negative.   Skin: Negative.  Negative for rash.  Neurological: Negative.  Negative for sensory change, focal weakness and weakness.  Psychiatric/Behavioral: Negative.  The patient is not nervous/anxious.     As per HPI. Otherwise, a complete review of systems is negative.  PAST MEDICAL HISTORY: Past Medical History:  Diagnosis Date  . Arthritis    fingers, hands, knees  . Cancer [facility]) 2009   Right breast  . Colon cancer ([place]) 2013  . Degenerative joint disease    thumbs  . Dental bridge present   . Personal history of radiation therapy 2009   Right breast    PAST SURGICAL HISTORY: Past  Surgical History:  Procedure Laterality Date  . BREAST BIOPSY Left    neg-  . BREAST EXCISIONAL BIOPSY Right 2009   rad no chemo  . CATARACT EXTRACTION W/PHACO Left 09/24/2017   Procedure: CATARACT EXTRACTION PHACO AND INTRAOCULAR LENS PLACEMENT ([place])  LEFT;  Surgeon: Leandrew Koyanagi, MD;  Location: [facility];  Service: Ophthalmology;  Laterality: Left;  . CATARACT EXTRACTION W/PHACO Right 11/19/2017   Procedure: CATARACT EXTRACTION PHACO AND INTRAOCULAR LENS PLACEMENT ([place]) IVA TOPICAL;  Surgeon: Leandrew Koyanagi, MD;  Location: [place];  Service: Ophthalmology;  Laterality: Right;  . CHOLECYSTECTOMY    . COLON SURGERY  2015  . COLONOSCOPY WITH PROPOFOL N/A 07/22/2014   Procedure: COLONOSCOPY WITH PROPOFOL;  Surgeon: Lucilla Lame, MD;  Location: [facility];  Service: Endoscopy;  Laterality: N/A;  . KNEE ARTHROSCOPY Left 09/02/2018   Procedure: LEFT ARTHROSCOPY KNEE MENISETOMY AND CHONDROPLASTY;  Surgeon: Dereck Leep, MD;  Location: [facility];  Service: Orthopedics;  Laterality: Left;  . KNEE SURGERY Right   . POLYPECTOMY  07/22/2014   Procedure: POLYPECTOMY;  Surgeon: Lucilla Lame, MD;  Location: [place];  Service: Endoscopy;;  . TONSILLECTOMY    . TUBAL LIGATION      FAMILY HISTORY: Family History  Problem Relation Age of Onset  . Breast cancer Maternal Aunt   . Breast cancer Maternal Grandmother     ADVANCED DIRECTIVES (Y/N):  N  HEALTH MAINTENANCE: Social History   Tobacco Use  . Smoking status: Current Every Day Smoker    Packs/day: 0.50    Years: 20.00    Pack years: 10.00  . Smokeless tobacco:  Never Used  Vaping Use  . Vaping Use: Never used  Substance Use Topics  . Alcohol use: Yes    Alcohol/week: 1.0 standard drink    Types: 1 Cans of beer per week  . Drug use: Not on file     Colonoscopy:  PAP:  Bone density:  Lipid panel:  No Known Allergies  Current Outpatient Medications  Medication Sig Dispense  Refill  . Calcium Carb-Cholecalciferol (CALTRATE 600+D3 PO) Take 1 tablet by mouth daily.    . Cholecalciferol (EQL VITAMIN D3) 50 MCG (2000 UT) CAPS Take 2,000 Units by mouth daily.    . Cyanocobalamin (VITAMIN B-12) 5000 MCG TBDP Take 5,000 mcg by mouth daily.     . Glucosamine-Chondroitin 1500-1200 MG/30ML LIQD Take 1 tablet by mouth daily.     Marland Kitchen HYDROcodone-acetaminophen (NORCO) 5-325 MG tablet Take 1-2 tablets by mouth every 4 (four) hours as needed for moderate pain. 20 tablet 0  . ibuprofen (ADVIL) 200 MG tablet Take 200 mg by mouth every 6 (six) hours as needed for headache or moderate pain.     No current facility-administered medications for this visit.   Facility-Administered Medications Ordered in Other Visits  Medication Dose Route Frequency Provider Last Rate Last Admin  . denosumab (PROLIA) injection 60 mg  60 mg Subcutaneous Once Lloyd Huger, MD        OBJECTIVE: Vitals:   07/01/19 1118  BP: 118/62  Pulse: (!) 56  Resp: 20  Temp: 97.9 F (36.6 C)  SpO2: 100%     Body mass index is 23.47 kg/m.    ECOG FS:0 - Asymptomatic  General: Well-developed, well-nourished, no acute distress. Eyes: Pink conjunctiva, anicteric sclera. HEENT: Normocephalic, moist mucous membranes. Lungs: No audible wheezing or coughing. Heart: Regular rate and rhythm. Abdomen: Soft, nontender, no obvious distention. Musculoskeletal: No edema, cyanosis, or clubbing. Neuro: Alert, answering all questions appropriately. Cranial nerves grossly intact. Skin: No rashes or petechiae noted. Psych: Normal affect.  LAB RESULTS:  Lab Results  Component Value Date   NA 139 07/01/2019   K 4.6 07/01/2019   CL 106 07/01/2019   CO2 26 07/01/2019   GLUCOSE 99 07/01/2019   BUN 16 07/01/2019   CREATININE 0.73 07/01/2019   CALCIUM 9.4 07/01/2019   PROT 7.3 07/01/2019   ALBUMIN 4.1 07/01/2019   AST 18 07/01/2019   ALT 16 07/01/2019   ALKPHOS 60 07/01/2019   BILITOT 0.9 07/01/2019    GFRNONAA >60 07/01/2019   GFRAA >60 07/01/2019    Lab Results  Component Value Date   WBC 7.3 12/28/2018   NEUTROABS 4.8 12/28/2018   HGB 12.5 12/28/2018   HCT 38.7 12/28/2018   MCV 95.3 12/28/2018   PLT 224 12/28/2018     STUDIES: No results found.  ASSESSMENT: Pathologic stage IA ER/PR positive, HER-2 negative invasive carcinoma of the right breast, unspecified site. Low risk Oncotype.  PLAN:    1. Pathologic stage IA ER/PR positive, HER-2 negative invasive carcinoma of the right breast, unspecified site. Low risk Oncotype: Given her low risk Oncotype, patient did not require adjuvant chemotherapy. By report she did not complete adjuvant XRT. She has also completed 5 years of an aromatase inhibitor.  Her most recent mammogram on June 18, 2018 was reported as BI-RADS 1.  Repeat in the next 1 to 2 weeks. 2. Osteoporosis: Patient's most recent bone mineral density on July 02, 2018 reported T score of -2.1.  This is slightly worse than 1 year prior where the T  score was -1.8.  Although scheduled for Prolia today, patient has declined further treatment stating a friend fractured her femur while on Prolia and is hesitant to pursue any further interventions.  Her next bone mineral density is due in the next 4 to 6 weeks. 3. History of colon cancer: Diagnosed in February 2015.  Status post partial colectomy.  Given her low stage of disease, adjuvant chemotherapy was not necessary. She reports a recent normal colonoscopy. No intervention is needed.  4.  Disposition: After lengthy discussion with the patient, it was agreed upon that since she has no longer receiving Prolia and is greater than 10 years removed from her breast cancer diagnosis, no further follow-up is necessary.  Will defer future mammograms and bone mineral density to primary care physicians.  Please refer patient back if there are any questions or concerns.   Patient expressed understanding and was in agreement with this plan. She  also understands that She can call clinic at any time with any questions, concerns, or complaints.    Lloyd Huger, MD   07/02/2019 8:46 PM

## 2019-07-01 ENCOUNTER — Encounter: Payer: Self-pay | Admitting: Oncology

## 2019-07-01 ENCOUNTER — Inpatient Hospital Stay: Payer: Medicare Other | Attending: Oncology

## 2019-07-01 ENCOUNTER — Other Ambulatory Visit: Payer: Self-pay

## 2019-07-01 ENCOUNTER — Inpatient Hospital Stay: Payer: Medicare Other | Admitting: Oncology

## 2019-07-01 ENCOUNTER — Inpatient Hospital Stay: Payer: Medicare Other

## 2019-07-01 VITALS — BP 118/62 | HR 56 | Temp 97.9°F | Resp 20 | Wt 132.5 lb

## 2019-07-01 DIAGNOSIS — M810 Age-related osteoporosis without current pathological fracture: Secondary | ICD-10-CM | POA: Diagnosis not present

## 2019-07-01 DIAGNOSIS — Z170 Estrogen receptor positive status [ER+]: Secondary | ICD-10-CM | POA: Diagnosis not present

## 2019-07-01 DIAGNOSIS — Z923 Personal history of irradiation: Secondary | ICD-10-CM | POA: Insufficient documentation

## 2019-07-01 DIAGNOSIS — Z9221 Personal history of antineoplastic chemotherapy: Secondary | ICD-10-CM | POA: Diagnosis not present

## 2019-07-01 DIAGNOSIS — Z85038 Personal history of other malignant neoplasm of large intestine: Secondary | ICD-10-CM | POA: Diagnosis not present

## 2019-07-01 DIAGNOSIS — F17210 Nicotine dependence, cigarettes, uncomplicated: Secondary | ICD-10-CM | POA: Insufficient documentation

## 2019-07-01 DIAGNOSIS — Z853 Personal history of malignant neoplasm of breast: Secondary | ICD-10-CM | POA: Insufficient documentation

## 2019-07-01 DIAGNOSIS — C50911 Malignant neoplasm of unspecified site of right female breast: Secondary | ICD-10-CM

## 2019-07-01 LAB — COMPREHENSIVE METABOLIC PANEL
ALT: 16 U/L (ref 0–44)
AST: 18 U/L (ref 15–41)
Albumin: 4.1 g/dL (ref 3.5–5.0)
Alkaline Phosphatase: 60 U/L (ref 38–126)
Anion gap: 7 (ref 5–15)
BUN: 16 mg/dL (ref 8–23)
CO2: 26 mmol/L (ref 22–32)
Calcium: 9.4 mg/dL (ref 8.9–10.3)
Chloride: 106 mmol/L (ref 98–111)
Creatinine, Ser: 0.73 mg/dL (ref 0.44–1.00)
GFR calc Af Amer: >60 mL/min (ref >60)
GFR calc non Af Amer: >60 mL/min (ref >60)
Glucose, Bld: 99 mg/dL (ref 70–99)
Potassium: 4.6 mmol/L (ref 3.5–5.1)
Sodium: 139 mmol/L (ref 135–145)
Total Bilirubin: 0.9 mg/dL (ref 0.3–1.2)
Total Protein: 7.3 g/dL (ref 6.5–8.1)

## 2019-07-01 MED ORDER — DENOSUMAB 60 MG/ML ~~LOC~~ SOSY
60.0000 mg | PREFILLED_SYRINGE | Freq: Once | SUBCUTANEOUS | Status: AC
  Filled 2019-07-01: qty 1

## 2019-07-02 LAB — CEA: CEA: 2.5 ng/mL (ref 0.0–4.7)

## 2019-07-06 ENCOUNTER — Ambulatory Visit
Admission: RE | Admit: 2019-07-06 | Discharge: 2019-07-06 | Disposition: A | Discharge status: Home / Self Care | Payer: Medicare Other | Source: Ambulatory Visit | Attending: Oncology | Admitting: Oncology

## 2019-07-06 DIAGNOSIS — Z1231 Encounter for screening mammogram for malignant neoplasm of breast: Secondary | ICD-10-CM | POA: Insufficient documentation

## 2019-07-06 DIAGNOSIS — Z853 Personal history of malignant neoplasm of breast: Secondary | ICD-10-CM | POA: Diagnosis not present

## 2019-07-06 DIAGNOSIS — C50911 Malignant neoplasm of unspecified site of right female breast: Secondary | ICD-10-CM

## 2019-07-06 HISTORY — DX: Malignant neoplasm of unspecified site of unspecified female breast: C50.919

## 2020-08-03 ENCOUNTER — Other Ambulatory Visit: Payer: Self-pay | Admitting: Family Medicine

## 2020-08-03 DIAGNOSIS — Z1231 Encounter for screening mammogram for malignant neoplasm of breast: Secondary | ICD-10-CM

## 2020-08-23 ENCOUNTER — Other Ambulatory Visit: Payer: Self-pay

## 2020-08-23 ENCOUNTER — Ambulatory Visit
Admission: RE | Admit: 2020-08-23 | Discharge: 2020-08-23 | Disposition: A | Discharge status: Home / Self Care | Payer: Medicare Other | Source: Ambulatory Visit | Attending: Family Medicine | Admitting: Family Medicine

## 2020-08-23 DIAGNOSIS — Z1231 Encounter for screening mammogram for malignant neoplasm of breast: Secondary | ICD-10-CM | POA: Diagnosis not present

## 2020-09-15 IMAGING — MG DIGITAL SCREENING BILATERAL MAMMOGRAM WITH TOMO AND CAD
8 series · 9 of 24 positions shown · non-contrast
Comparison: Previous exam(s).

CLINICAL DATA: Screening.

EXAM:
DIGITAL SCREENING BILATERAL MAMMOGRAM WITH TOMO AND CAD

[L MLO synth-2D]
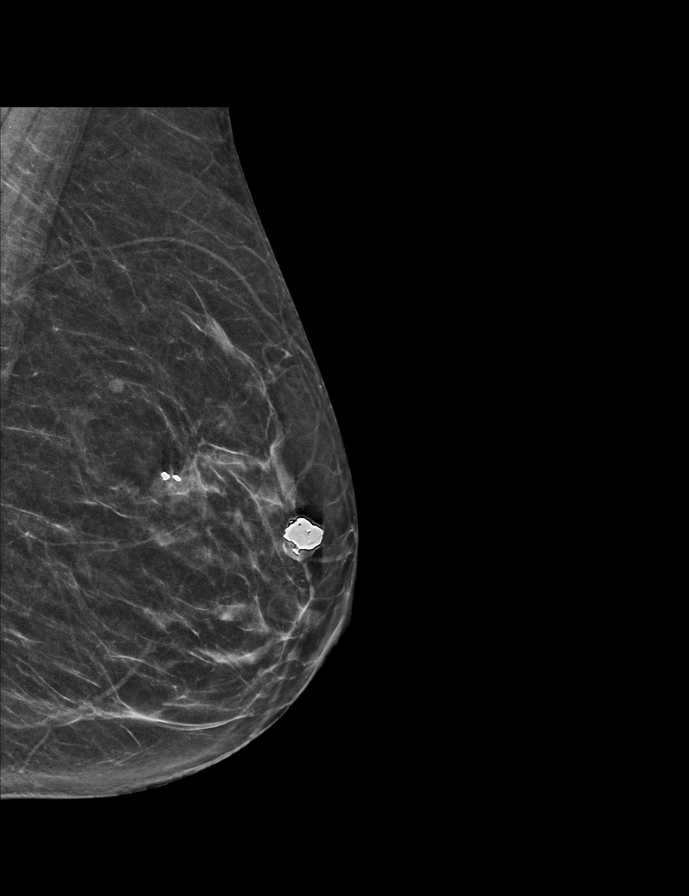

[R MLO synth-2D]
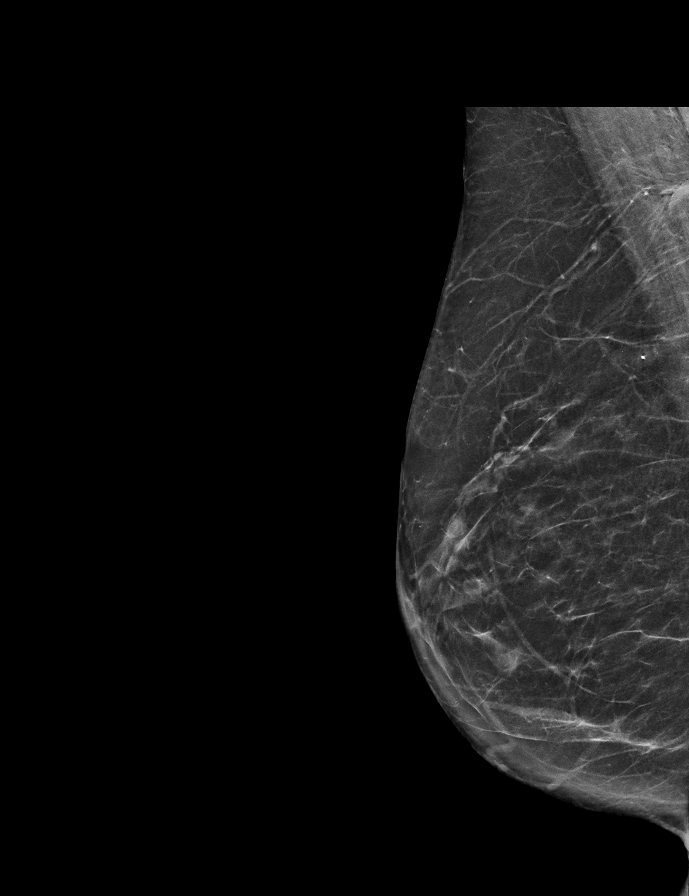

[L CC synth-2D]
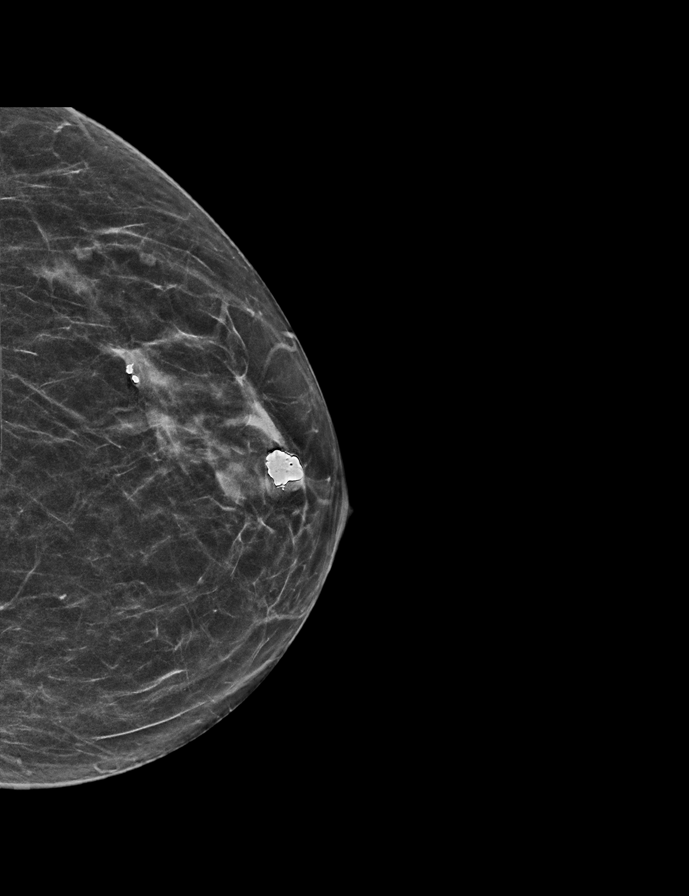

[R CC synth-2D]
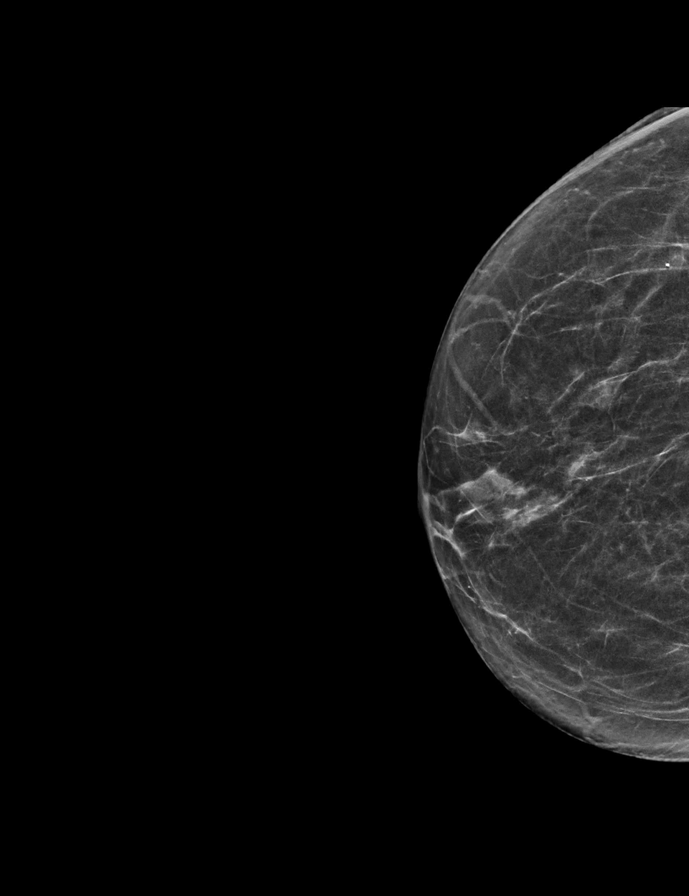

[L MLO tomo · 2 of 51 frames shown]
[frame 17/51]
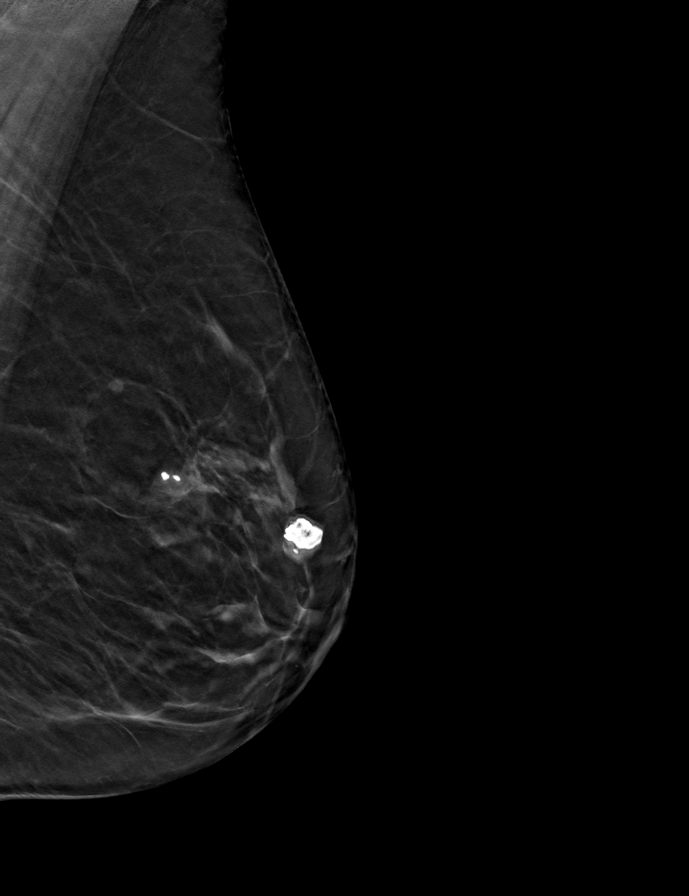
[frame 26/51]
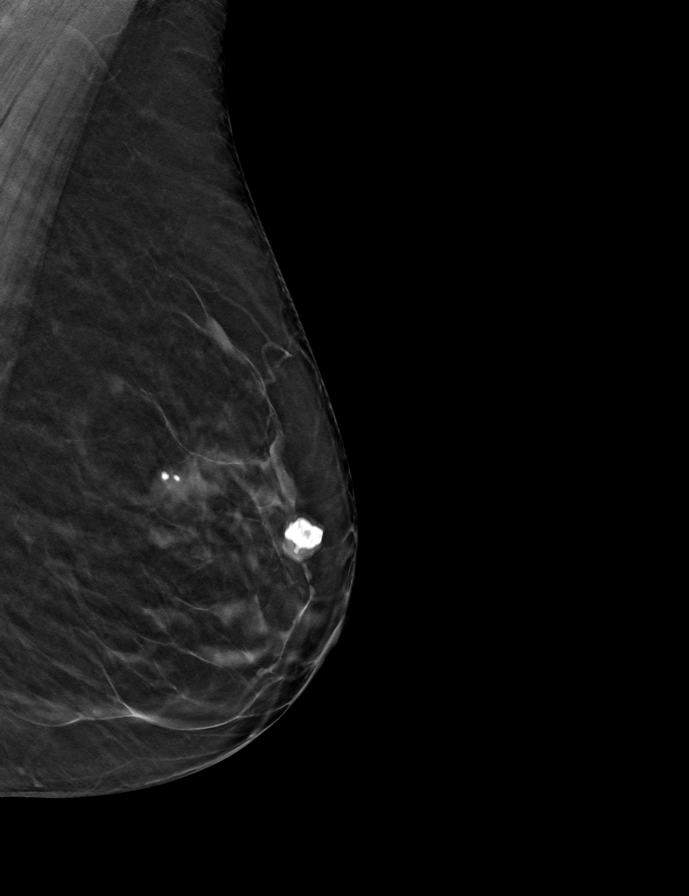

[L CC tomo · tomo slice 24/47.0]
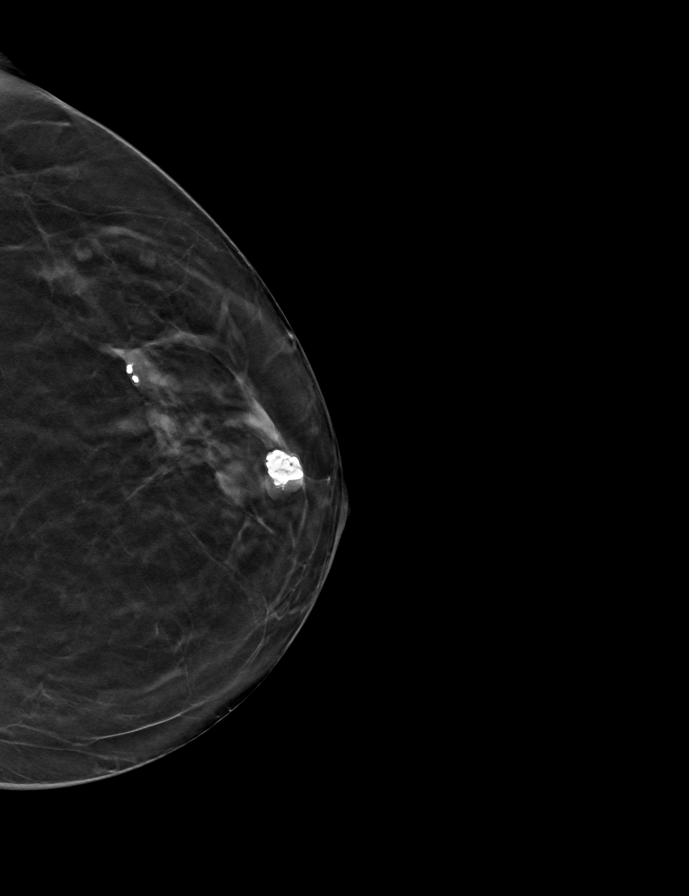

[R MLO tomo · tomo slice 31/61.0]
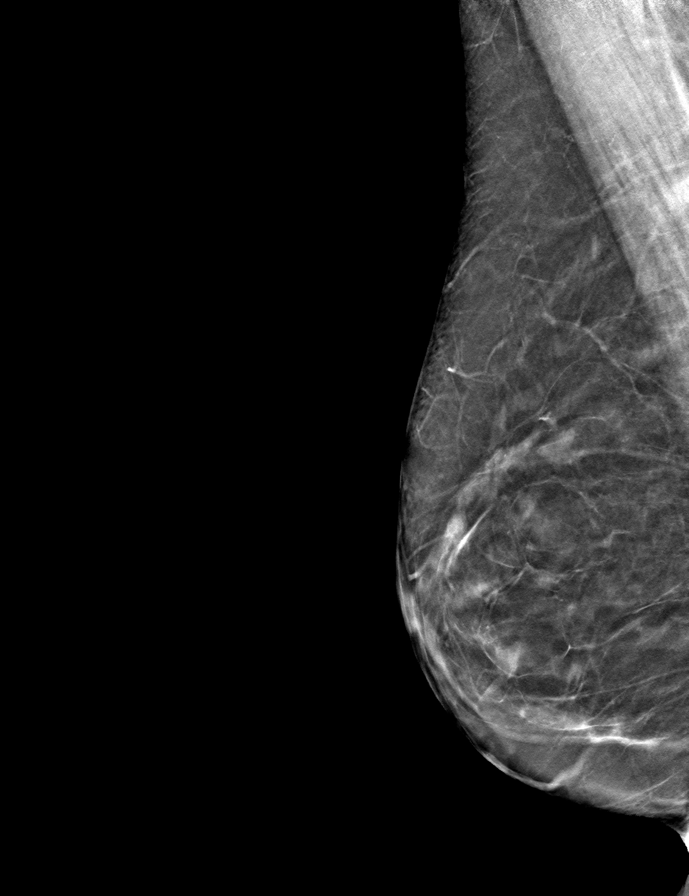

[R CC tomo · tomo slice 26/51.0]
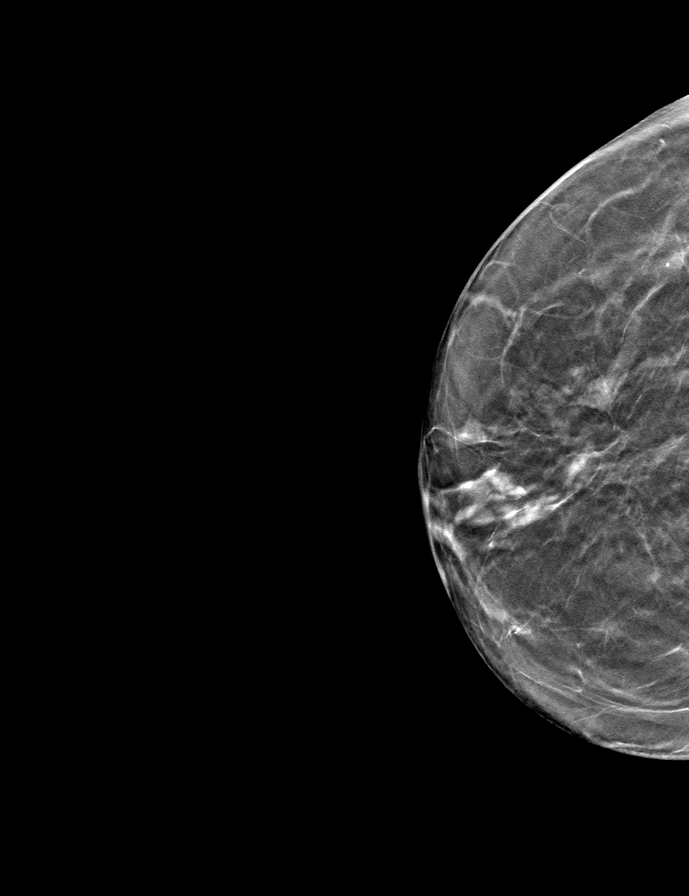

[9 of 24 positions shown; findings below may reference images not displayed]

ACR Breast Density Category b: There are scattered areas of
fibroglandular density.
FINDINGS: There are no findings suspicious for malignancy. Images were
processed with CAD.
IMPRESSION: No mammographic evidence of malignancy. A result letter of this
screening mammogram will be mailed directly to the patient.

RECOMMENDATION:
Screening mammogram in one year. (Code:[id number])

BI-RADS CATEGORY  1: Negative.

## 2021-06-01 NOTE — Discharge Instructions (Signed)
Instructions after Total Knee Replacement   Laura Graham, Jr., M.D.     Dept. of Orthopaedics & Sports Medicine  [facility]  [address]  [place], [place]  [address]  Phone: [phone]   Fax: [phone]    DIET: Drink plenty of non-alcoholic fluids. Resume your normal diet. Include foods high in fiber.  ACTIVITY:  You may use crutches or a walker with weight-bearing as tolerated, unless instructed otherwise. You may be weaned off of the walker or crutches by your Physical Therapist.  Do NOT place pillows under the knee. Anything placed under the knee could limit your ability to straighten the knee.   Continue doing gentle exercises. Exercising will reduce the pain and swelling, increase motion, and prevent muscle weakness.   Please continue to use the TED compression stockings for 6 weeks. You may remove the stockings at night, but should reapply them in the morning. Do not drive or operate any equipment until instructed.  WOUND CARE:  Continue to use the PolarCare or ice packs periodically to reduce pain and swelling. You may bathe or shower after the staples are removed at the first office visit following surgery.  MEDICATIONS: You may resume your regular medications. Please take the pain medication as prescribed on the medication. Do not take pain medication on an empty stomach. You have been given a prescription for a blood thinner (Lovenox or Coumadin). Please take the medication as instructed. (NOTE: After completing a 2 week course of Lovenox, take one Enteric-coated aspirin once a day. This along with elevation will help reduce the possibility of phlebitis in your operated leg.) Do not drive or drink alcoholic beverages when taking pain medications.  CALL THE OFFICE FOR: Temperature above 101 degrees Excessive bleeding or drainage on the dressing. Excessive swelling, coldness, or paleness of the toes. Persistent nausea and vomiting.  FOLLOW-UP:  You  should have an appointment to return to the office in 10-14 days after surgery. Arrangements have been made for continuation of Physical Therapy (either home therapy or outpatient therapy).   [facility] Department Directory         [email or web address]       [email or web address]          Cardiology  Appointments: [place] - [phone] [place] - [phone]  Endocrinology  Appointments: [place] - [phone] [place] - [phone]  Gastroenterology  Appointments: [place] - [phone] [place] - [phone]        General Surgery   Appointments: [place] - [phone]  Internal Medicine/Family Medicine  Appointments: [place] - [phone] [place] - [phone] [place] - [phone]  Metabolic and Weigh Loss Surgery  Appointments: [place] - [phone]        Neurology  Appointments: [place] - [phone] [place] - [phone]  Neurosurgery  Appointments: [place] - [phone]  Obstetrics & Gynecology  Appointments: [place] - [phone] [place] - [phone]        Pediatrics  Appointments: [place] - [phone] [place] - [phone]  Physiatry  Appointments: [place] -[phone]  Physical Therapy  Appointments: [place] - [phone] [place] - [phone]        Podiatry  Appointments: [place] - [phone] [place] - [phone]  Pulmonology  Appointments: [place] - [phone]  Rheumatology  Appointments:  - [phone]         Location: [facility]  [address] , [place]  [address]  [place] Location: [facility] [address] [place], [place]  [address]  [place] Location: [facility] [address] [place], [place]  [address]    

## 2021-06-05 ENCOUNTER — Encounter
Admission: RE | Admit: 2021-06-05 | Discharge: 2021-06-05 | Disposition: A | Discharge status: Home / Self Care | Payer: Medicare Other | Source: Ambulatory Visit | Attending: Orthopedic Surgery | Admitting: Orthopedic Surgery

## 2021-06-05 DIAGNOSIS — M1711 Unilateral primary osteoarthritis, right knee: Secondary | ICD-10-CM | POA: Diagnosis not present

## 2021-06-05 DIAGNOSIS — E041 Nontoxic single thyroid nodule: Secondary | ICD-10-CM | POA: Diagnosis not present

## 2021-06-05 DIAGNOSIS — C50911 Malignant neoplasm of unspecified site of right female breast: Secondary | ICD-10-CM

## 2021-06-05 DIAGNOSIS — Z980 Intestinal bypass and anastomosis status: Secondary | ICD-10-CM

## 2021-06-05 DIAGNOSIS — Z170 Estrogen receptor positive status [ER+]: Secondary | ICD-10-CM | POA: Diagnosis not present

## 2021-06-05 DIAGNOSIS — M810 Age-related osteoporosis without current pathological fracture: Secondary | ICD-10-CM | POA: Insufficient documentation

## 2021-06-05 DIAGNOSIS — E789 Disorder of lipoprotein metabolism, unspecified: Secondary | ICD-10-CM

## 2021-06-05 DIAGNOSIS — Z01818 Encounter for other preprocedural examination: Secondary | ICD-10-CM | POA: Diagnosis present

## 2021-06-05 DIAGNOSIS — Z01812 Encounter for preprocedural laboratory examination: Secondary | ICD-10-CM

## 2021-06-05 HISTORY — DX: Hyperlipidemia, unspecified: E78.5

## 2021-06-05 HISTORY — DX: Age-related osteoporosis without current pathological fracture: M81.0

## 2021-06-05 HISTORY — DX: Nontoxic single thyroid nodule: E04.1

## 2021-06-05 LAB — CBC
HCT: 41.3 % (ref 36.0–46.0)
Hemoglobin: 13.2 g/dL (ref 12.0–15.0)
MCH: 29.9 pg (ref 26.0–34.0)
MCHC: 32 g/dL (ref 30.0–36.0)
MCV: 93.7 fL (ref 80.0–100.0)
Platelets: 234 10*3/uL (ref 150–400)
RBC: 4.41 MIL/uL (ref 3.87–5.11)
RDW: 11.6 % (ref 11.5–15.5)
WBC: 8 10*3/uL (ref 4.0–10.5)
nRBC: 0 % (ref 0.0–0.2)

## 2021-06-05 LAB — TYPE AND SCREEN
ABO/RH(D): A POS
Antibody Screen: NEGATIVE

## 2021-06-05 LAB — COMPREHENSIVE METABOLIC PANEL
ALT: 14 U/L (ref 0–44)
AST: 14 U/L — ABNORMAL LOW (ref 15–41)
Albumin: 4.3 g/dL (ref 3.5–5.0)
Alkaline Phosphatase: 85 U/L (ref 38–126)
Anion gap: 7 (ref 5–15)
BUN: 16 mg/dL (ref 8–23)
CO2: 29 mmol/L (ref 22–32)
Calcium: 9.7 mg/dL (ref 8.9–10.3)
Chloride: 105 mmol/L (ref 98–111)
Creatinine, Ser: 0.83 mg/dL (ref 0.44–1.00)
GFR, Estimated: >60 mL/min (ref >60)
Glucose, Bld: 94 mg/dL (ref 70–99)
Potassium: 4.2 mmol/L (ref 3.5–5.1)
Sodium: 141 mmol/L (ref 135–145)
Total Bilirubin: 1 mg/dL (ref 0.3–1.2)
Total Protein: 7.5 g/dL (ref 6.5–8.1)

## 2021-06-05 LAB — URINALYSIS, ROUTINE W REFLEX MICROSCOPIC
Bacteria, UA: NONE SEEN
Bilirubin Urine: NEGATIVE
Glucose, UA: NEGATIVE mg/dL
Ketones, ur: NEGATIVE mg/dL
Leukocytes,Ua: NEGATIVE
Nitrite: NEGATIVE
Protein, ur: NEGATIVE mg/dL
Specific Gravity, Urine: 1.011 (ref 1.005–1.030)
pH: 6 (ref 5.0–8.0)

## 2021-06-05 LAB — C-REACTIVE PROTEIN: CRP: <0.5 mg/dL (ref <1.0)

## 2021-06-05 LAB — SEDIMENTATION RATE: Sed Rate: 11 mm/hr (ref 0–30)

## 2021-06-05 LAB — SURGICAL PCR SCREEN
MRSA, PCR: NEGATIVE
Staphylococcus aureus: NEGATIVE

## 2021-06-05 NOTE — Patient Instructions (Addendum)
Your procedure is scheduled on: Friday, June 2 Report to the Registration Desk on the 1st floor of the [facility]. To find out your arrival time, please call [phone] between 1PM - 3PM on: Thursday, June 1 If your arrival time is 6:00 am, do not arrive prior to that time as the [facility] entrance doors do not open until 6:00 am.  REMEMBER: Instructions that are not followed completely may result in serious medical risk, up to and including death; or upon the discretion of your surgeon and anesthesiologist your surgery may need to be rescheduled.  Do not eat food after midnight the night before surgery.  No gum chewing, lozengers or hard candies.  You may however, drink CLEAR liquids up to 2 hours before you are scheduled to arrive for your surgery. Do not drink anything within 2 hours of your scheduled arrival time.  Clear liquids include: - water  - apple juice without pulp - gatorade (not RED colors) - black coffee or tea (Do NOT add milk or creamers to the coffee or tea) Do NOT drink anything that is not on this list.  In addition, your doctor has ordered for you to drink the provided  Ensure Pre-Surgery Clear Carbohydrate Drink  Drinking this carbohydrate drink up to two hours before surgery helps to reduce insulin resistance and improve patient outcomes. Please complete drinking 2 hours prior to scheduled arrival time.  DO NOT TAKE ANY MEDICATIONS THE MORNING OF SURGERY  One week prior to surgery: starting May 26 Stop Anti-inflammatories (NSAIDS) such as Advil, Aleve, Ibuprofen, Motrin, Naproxen, Naprosyn and Aspirin based products such as Excedrin, Goodys Powder, BC Powder. Stop ANY OVER THE COUNTER supplements until after surgery. Stop calcium, vitamin D, glucosamine. You may however, continue to take Tylenol if needed for pain up until the day of surgery.  No Alcohol for 24 hours before or after surgery.  No Smoking including e-cigarettes for 24 hours prior to  surgery.  No chewable tobacco products for at least 6 hours prior to surgery.  No nicotine patches on the day of surgery.  Do not use any "recreational" drugs for at least a week prior to your surgery.  Please be advised that the combination of cocaine and anesthesia may have negative outcomes, up to and including death. If you test positive for cocaine, your surgery will be cancelled.  On the morning of surgery brush your teeth with toothpaste and water, you may rinse your mouth with mouthwash if you wish. Do not swallow any toothpaste or mouthwash.  Use CHG Soap as directed on instruction sheet.  Do not wear jewelry, make-up, hairpins, clips or nail polish.  Do not wear lotions, powders, or perfumes.   Do not shave body from the neck down 48 hours prior to surgery just in case you cut yourself which could leave a site for infection.  Also, freshly shaved skin may become irritated if using the CHG soap.  Contact lenses, hearing aids and dentures may not be worn into surgery.  Do not bring valuables to the hospital. [facility] is not responsible for any missing/lost belongings or valuables.   Notify your doctor if there is any change in your medical condition (cold, fever, infection).  Wear comfortable clothing (specific to your surgery type) to the hospital.  After surgery, you can help prevent lung complications by doing breathing exercises.  Take deep breaths and cough every 1-2 hours. Your doctor may order a device called an Incentive Spirometer to help  you take deep breaths.  If you are being admitted to the hospital overnight, leave your suitcase in the car. After surgery it may be brought to your room.  If you are being discharged the day of surgery, you will not be allowed to drive home. You will need a responsible adult (18 years or older) to drive you home and stay with you that night.   If you are taking public transportation, you will need to have a responsible adult  (18 years or older) with you. Please confirm with your physician that it is acceptable to use public transportation.   Please call the [facility] at [phone] if you have any questions about these instructions.  Surgery Visitation Policy:  Patients undergoing a surgery or procedure may have two family members or support persons with them as long as the person is not COVID-19 positive or experiencing its symptoms.   Inpatient Visitation:    Visiting hours are 7 a.m. to 8 p.m. Up to four visitors are allowed at one time in a patient room, including children. The visitors may rotate out with other people during the day. One designated support person (adult) may remain overnight.    Preparing for Surgery with [place] (CHG) Soap    Before surgery, you can play an important role by reducing the number of germs on your skin.  CHG (Chlorhexidine gluconate) soap is an antiseptic cleanser which kills germs and bonds with the skin to continue killing germs even after washing.  Please do not use if you have an allergy to CHG or antibacterial soaps. If your skin becomes reddened/irritated stop using the CHG.  1. Shower the NIGHT BEFORE SURGERY and the MORNING OF SURGERY with CHG soap.  2. If you choose to wash your hair, wash your hair first as usual with your normal shampoo.  3. After shampooing, rinse your hair and body thoroughly to remove the shampoo.  4. Use CHG as you would any other liquid soap. You can apply CHG directly to the skin and wash gently with a scrungie or a clean washcloth.  5. Apply the CHG soap to your body only from the neck down. Do not use on open wounds or open sores. Avoid contact with your eyes, ears, mouth, and genitals (private parts). Wash face and genitals (private parts) with your normal soap.  6. Wash thoroughly, paying special attention to the area where your surgery will be performed.  7. Thoroughly rinse your body with  warm water.  8. Do not shower/wash with your normal soap after using and rinsing off the CHG soap.  9. Pat yourself dry with a clean towel.  10. Wear clean pajamas to bed the night before surgery.  12. Place clean sheets on your bed the night of your first shower and do not sleep with pets.  13. Shower again with the CHG soap on the day of surgery prior to arriving at the hospital.  14. Do not apply any deodorants/lotions/powders.  15. Please wear clean clothes to the hospital.

## 2021-06-14 ENCOUNTER — Encounter: Payer: Self-pay | Admitting: Orthopedic Surgery

## 2021-06-14 NOTE — H&P (Signed)
ORTHOPAEDIC HISTORY & PHYSICAL Gwenlyn Fudge, [place] - 06/01/2021 1:15 PM EDT Formatting of this note is different from the original. [place] [facility] Chief Complaint:   Chief Complaint  Patient presents with   Knee Pain  H & P RIGHT KNEE   History of Present Illness:   Laura Graham is a 76 y.o. female that presents to clinic today for her preoperative history and evaluation. Patient presents with her husband. The patient is scheduled to undergo a right total knee arthroplasty on 06/15/21 by Dr. Marry Guan. Her pain began many years ago. The pain is located along the lateral aspect of the knee. She describes her pain as worse with weightbearing. She reports associated swelling with some giving way of the knee. She denies associated numbness or tingling, denies locking.   The patient's symptoms have progressed to the point that they decrease her quality of life. The patient has previously undergone conservative treatment including NSAIDS and injections to the knee without adequate control of her symptoms.  Denies history of lumbar surgery, significant cardiac history, or history of DVT.   Denies significant drug allergy.   Past Medical, Surgical, Family, Social History, Allergies, Medications:   Past Medical History:  Past Medical History:  Diagnosis Date   Arthritis   Breast cancer (CMS-HCC)   Colon cancer (CMS-HCC)   Hyperlipidemia   Past Surgical History:  Past Surgical History:  Procedure Laterality Date   Left knee arthroscopy, partial lateral meniscectomy, and chondroplasty 09/02/2018  Dr Marry Guan   Breast tumor Right   Colon cancer   KNEE ARTHROSCOPY Right  Debridement   TONSILLECTOMY   TUBAL LIGATION   Current Medications:  Current Outpatient Medications  Medication Sig Dispense Refill   calcium citrate (CALCITRATE) 200 mg (950 mg) tablet Take 1 tablet by mouth once daily   cholecalciferol (VITAMIN D3) 1000 unit tablet Take  1,000 Units by mouth once daily   glucosamine-chondroitin 500-400 mg tablet Take 1 tablet by mouth once daily   ibuprofen (MOTRIN) 200 MG tablet Take 200-400 mg by mouth every 8 (eight) hours as needed for Pain   No current facility-administered medications for this visit.   Allergies: No Known Allergies  Social History:  Social History   Socioeconomic History   Marital status: Married  Spouse name: Beckie Busing   Number of children: 2   Years of education: 12   Highest education level: High school graduate  Occupational History   Occupation: Retired  Tobacco Use   Smoking status: Every Day  Packs/day: 0.50  Years: 30.00  Pack years: 15.00  Types: Cigarettes   Smokeless tobacco: Never  Vaping Use   Vaping Use: Never used  Substance and Sexual Activity   Alcohol use: Yes  Alcohol/week: 1.0 standard drink  Types: 1 Standard drinks or equivalent per week  Comment: 1 drink a week   Drug use: Never   Sexual activity: Defer  Partners: Male   Family History:  Family History  Problem Relation Age of Onset   Breast cancer Maternal Aunt   Breast cancer Maternal Grandmother   Review of Systems:   A 10+ ROS was performed, reviewed, and the pertinent orthopaedic findings are documented in the HPI.   Physical Examination:   BP 110/80 (BP Location: Left upper arm, Patient Position: Sitting, BP Cuff Size: Adult)  Ht 160 cm ('5\' 3"'$ )  Wt 59.7 kg (131 lb 9.6 oz)  BMI 23.31 kg/m   Patient is a well-developed, well-nourished female  in no acute distress. Patient has normal mood and affect. Patient is alert and oriented to person, place, and time.   HEENT: Atraumatic, normocephalic. Pupils equal and reactive to light. Extraocular motion intact. Noninjected sclera.  Cardiovascular: Borderline bradycardic rate and regular rhythm, with no murmurs, rubs, or gallops. Distal pulses palpable.  Respiratory: Lungs clear to auscultation bilaterally.   Right Knee: Soft tissue swelling:  minimal Effusion: none Erythema: none Crepitance: mild Tenderness: lateral Alignment: relative valgus Mediolateral laxity: lateral pseudolaxity Posterior sag: negative Patellar tracking: Good tracking without evidence of subluxation or tilt Atrophy: No significant atrophy.  Quadriceps tone was fair to good. Range of motion: 0/9/138 degrees  Patient able to dorsiflex and plantarflex the right ankle. Able to flex and extend the toes.  Sensation intact over the saphenous, lateral sural cutaneous, superficial fibular, and deep fibular nerve distributions.  Tests Performed/Reviewed:  X-rays  3 views of the right knee were reviewed. Images reveal loss of lateral compartment joint space with osteophyte formation noted. No fractures or other osseous abnormality noted.  Impression:   ICD-10-CM  1. Primary osteoarthritis of right knee M17.11   Plan:   The patient has end-stage degenerative changes of the right knee. It was explained to the patient that the condition is progressive in nature. Having failed conservative treatment, the patient has elected to proceed with a total joint arthroplasty. The patient will undergo a total joint arthroplasty with Dr. Marry Guan. The risks of surgery, including blood clot and infection, were discussed with the patient. Measures to reduce these risks, including the use of anticoagulation, perioperative antibiotics, and early ambulation were discussed. The importance of postoperative physical therapy was discussed with the patient. The patient elects to proceed with surgery. The patient is instructed to stop all blood thinners prior to surgery. The patient is instructed to call the hospital the day before surgery to learn of the proper arrival time.   Contact our office with any questions or concerns. Follow up as indicated, or sooner should any new problems arise, if conditions worsen, or if they are otherwise concerned.   Gwenlyn Fudge, [facility] [place], [place] [address] Phone: [phone]  This note was generated in part with voice recognition software and I apologize for any typographical errors that were not detected and corrected.  Electronically signed by Gwenlyn Fudge, PA at 06/06/2021 5:46 PM EDT

## 2021-06-15 ENCOUNTER — Observation Stay: Payer: Medicare Other

## 2021-06-15 ENCOUNTER — Encounter
Admission: RE | Disposition: A | Discharge status: Home Health | Payer: Self-pay | Source: Home / Self Care | Attending: Orthopedic Surgery

## 2021-06-15 ENCOUNTER — Other Ambulatory Visit: Payer: Self-pay

## 2021-06-15 ENCOUNTER — Ambulatory Visit: Payer: Medicare Other | Admitting: Anesthesiology

## 2021-06-15 ENCOUNTER — Observation Stay
Admission: RE | Admit: 2021-06-15 | Discharge: 2021-06-16 | Disposition: A | Discharge status: Home Health | Payer: Medicare Other | Attending: Orthopedic Surgery | Admitting: Orthopedic Surgery

## 2021-06-15 ENCOUNTER — Encounter: Payer: Self-pay | Admitting: Orthopedic Surgery

## 2021-06-15 ENCOUNTER — Ambulatory Visit: Payer: Medicare Other | Admitting: Urgent Care

## 2021-06-15 DIAGNOSIS — M810 Age-related osteoporosis without current pathological fracture: Secondary | ICD-10-CM

## 2021-06-15 DIAGNOSIS — Z853 Personal history of malignant neoplasm of breast: Secondary | ICD-10-CM | POA: Diagnosis not present

## 2021-06-15 DIAGNOSIS — E789 Disorder of lipoprotein metabolism, unspecified: Secondary | ICD-10-CM

## 2021-06-15 DIAGNOSIS — Z85038 Personal history of other malignant neoplasm of large intestine: Secondary | ICD-10-CM | POA: Diagnosis not present

## 2021-06-15 DIAGNOSIS — Z980 Intestinal bypass and anastomosis status: Secondary | ICD-10-CM

## 2021-06-15 DIAGNOSIS — M1711 Unilateral primary osteoarthritis, right knee: Secondary | ICD-10-CM | POA: Diagnosis not present

## 2021-06-15 DIAGNOSIS — E041 Nontoxic single thyroid nodule: Secondary | ICD-10-CM

## 2021-06-15 DIAGNOSIS — Z96659 Presence of unspecified artificial knee joint: Secondary | ICD-10-CM

## 2021-06-15 DIAGNOSIS — Z01812 Encounter for preprocedural laboratory examination: Secondary | ICD-10-CM

## 2021-06-15 DIAGNOSIS — C50911 Malignant neoplasm of unspecified site of right female breast: Secondary | ICD-10-CM

## 2021-06-15 DIAGNOSIS — F17210 Nicotine dependence, cigarettes, uncomplicated: Secondary | ICD-10-CM | POA: Diagnosis not present

## 2021-06-15 HISTORY — PX: KNEE ARTHROPLASTY: SHX992

## 2021-06-15 LAB — ABO/RH: ABO/RH(D): A POS

## 2021-06-15 SURGERY — ARTHROPLASTY, KNEE, TOTAL, USING IMAGELESS COMPUTER-ASSISTED NAVIGATION
Anesthesia: Spinal | Site: Knee | Laterality: Right

## 2021-06-15 MED ORDER — CHLORHEXIDINE GLUCONATE 4 % EX LIQD
60.0000 mL | Freq: Once | CUTANEOUS | Status: AC
  Administered 2021-06-15: 4 via TOPICAL

## 2021-06-15 MED ORDER — PROPOFOL 1000 MG/100ML IV EMUL
INTRAVENOUS | Status: AC
  Filled 2021-06-15: qty 100

## 2021-06-15 MED ORDER — GABAPENTIN 300 MG PO CAPS: 300.0000 mg | ORAL_CAPSULE | Freq: Once | ORAL | Status: AC

## 2021-06-15 MED ORDER — 0.9 % SODIUM CHLORIDE (POUR BTL) OPTIME
TOPICAL | Status: DC | PRN
  Administered 2021-06-15: 500 mL

## 2021-06-15 MED ORDER — CEFAZOLIN SODIUM-DEXTROSE 2-4 GM/100ML-% IV SOLN
INTRAVENOUS | Status: AC
  Filled 2021-06-15: qty 100

## 2021-06-15 MED ORDER — PHENYLEPHRINE HCL (PRESSORS) 10 MG/ML IV SOLN
INTRAVENOUS | Status: AC
Start: 2021-06-15 — End: ?
  Filled 2021-06-15: qty 1

## 2021-06-15 MED ORDER — SODIUM CHLORIDE 0.9 % IV SOLN: INTRAVENOUS | Status: DC

## 2021-06-15 MED ORDER — MENTHOL 3 MG MT LOZG: 1.0000 | LOZENGE | OROMUCOSAL | Status: DC | PRN

## 2021-06-15 MED ORDER — METOCLOPRAMIDE HCL 10 MG PO TABS
10.0000 mg | ORAL_TABLET | Freq: Three times a day (TID) | ORAL | Status: DC
  Administered 2021-06-15 – 2021-06-16 (×3): 10 mg via ORAL
  Filled 2021-06-15 (×8): qty 1

## 2021-06-15 MED ORDER — ORAL CARE MOUTH RINSE: 15.0000 mL | Freq: Once | OROMUCOSAL | Status: AC

## 2021-06-15 MED ORDER — MAGNESIUM HYDROXIDE 400 MG/5ML PO SUSP
30.0000 mL | Freq: Every day | ORAL | Status: DC
  Administered 2021-06-15 – 2021-06-16 (×2): 30 mL via ORAL
  Filled 2021-06-15 (×2): qty 30

## 2021-06-15 MED ORDER — PROPOFOL 1000 MG/100ML IV EMUL
INTRAVENOUS | Status: AC
  Filled 2021-06-15: qty 200

## 2021-06-15 MED ORDER — CHLORHEXIDINE GLUCONATE 0.12 % MT SOLN: 15.0000 mL | Freq: Once | OROMUCOSAL | Status: AC

## 2021-06-15 MED ORDER — OXYCODONE HCL 5 MG PO TABS
5.0000 mg | ORAL_TABLET | ORAL | Status: DC | PRN
  Administered 2021-06-15: 5 mg via ORAL
  Filled 2021-06-15: qty 1

## 2021-06-15 MED ORDER — DEXAMETHASONE SODIUM PHOSPHATE 10 MG/ML IJ SOLN
INTRAMUSCULAR | Status: AC
  Administered 2021-06-15: 8 mg via INTRAVENOUS
  Filled 2021-06-15: qty 1

## 2021-06-15 MED ORDER — FLEET ENEMA 7-19 GM/118ML RE ENEM
1.0000 | ENEMA | Freq: Once | RECTAL | Status: DC | PRN
Start: 2021-06-15 — End: 2021-06-16

## 2021-06-15 MED ORDER — OXYCODONE HCL 5 MG/5ML PO SOLN: 5.0000 mg | Freq: Once | ORAL | Status: AC | PRN

## 2021-06-15 MED ORDER — ONDANSETRON HCL 4 MG/2ML IJ SOLN: 4.0000 mg | Freq: Four times a day (QID) | INTRAMUSCULAR | Status: DC | PRN

## 2021-06-15 MED ORDER — OXYCODONE HCL 5 MG PO TABS
5.0000 mg | ORAL_TABLET | Freq: Once | ORAL | Status: AC | PRN
  Administered 2021-06-15: 5 mg via ORAL

## 2021-06-15 MED ORDER — ENSURE PRE-SURGERY PO LIQD
296.0000 mL | Freq: Once | ORAL | Status: DC
  Filled 2021-06-15: qty 296

## 2021-06-15 MED ORDER — ACETAMINOPHEN 10 MG/ML IV SOLN
INTRAVENOUS | Status: DC | PRN
  Administered 2021-06-15: 1000 mg via INTRAVENOUS

## 2021-06-15 MED ORDER — FENTANYL CITRATE (PF) 100 MCG/2ML IJ SOLN
INTRAMUSCULAR | Status: AC
  Filled 2021-06-15: qty 2

## 2021-06-15 MED ORDER — ONDANSETRON HCL 4 MG PO TABS: 4.0000 mg | ORAL_TABLET | Freq: Four times a day (QID) | ORAL | Status: DC | PRN

## 2021-06-15 MED ORDER — OXYCODONE HCL 5 MG PO TABS
ORAL_TABLET | ORAL | Status: AC
  Filled 2021-06-15: qty 1

## 2021-06-15 MED ORDER — TRANEXAMIC ACID-NACL 1000-0.7 MG/100ML-% IV SOLN
INTRAVENOUS | Status: AC
  Administered 2021-06-15: 1000 mg via INTRAVENOUS
  Filled 2021-06-15: qty 100

## 2021-06-15 MED ORDER — TRANEXAMIC ACID-NACL 1000-0.7 MG/100ML-% IV SOLN
1000.0000 mg | INTRAVENOUS | Status: AC
  Administered 2021-06-15: 1000 mg via INTRAVENOUS

## 2021-06-15 MED ORDER — CELECOXIB 200 MG PO CAPS
ORAL_CAPSULE | ORAL | Status: AC
  Administered 2021-06-15: 400 mg via ORAL
  Filled 2021-06-15: qty 2

## 2021-06-15 MED ORDER — ALUM & MAG HYDROXIDE-SIMETH 200-200-20 MG/5ML PO SUSP
30.0000 mL | ORAL | Status: DC | PRN
  Filled 2021-06-15: qty 30

## 2021-06-15 MED ORDER — BUPIVACAINE LIPOSOME 1.3 % IJ SUSP
INTRAMUSCULAR | Status: AC
  Filled 2021-06-15: qty 20

## 2021-06-15 MED ORDER — LACTATED RINGERS IV SOLN: INTRAVENOUS | Status: DC

## 2021-06-15 MED ORDER — PHENOL 1.4 % MT LIQD: 1.0000 | OROMUCOSAL | Status: DC | PRN

## 2021-06-15 MED ORDER — PROPOFOL 500 MG/50ML IV EMUL
INTRAVENOUS | Status: DC | PRN
  Administered 2021-06-15: 200 ug/kg/min via INTRAVENOUS

## 2021-06-15 MED ORDER — SODIUM CHLORIDE FLUSH 0.9 % IV SOLN
INTRAVENOUS | Status: AC
  Filled 2021-06-15: qty 40

## 2021-06-15 MED ORDER — VITAMIN D 25 MCG (1000 UNIT) PO TABS
2000.0000 [IU] | ORAL_TABLET | Freq: Every day | ORAL | Status: DC
  Administered 2021-06-15 – 2021-06-16 (×2): 2000 [IU] via ORAL
  Filled 2021-06-15 (×2): qty 2

## 2021-06-15 MED ORDER — FENTANYL CITRATE (PF) 100 MCG/2ML IJ SOLN
25.0000 ug | INTRAMUSCULAR | Status: DC | PRN
  Administered 2021-06-15: 50 ug via INTRAVENOUS

## 2021-06-15 MED ORDER — BUPIVACAINE HCL (PF) 0.25 % IJ SOLN
INTRAMUSCULAR | Status: AC
  Filled 2021-06-15: qty 60

## 2021-06-15 MED ORDER — FERROUS SULFATE 325 (65 FE) MG PO TABS
325.0000 mg | ORAL_TABLET | Freq: Two times a day (BID) | ORAL | Status: DC
  Administered 2021-06-15 – 2021-06-16 (×2): 325 mg via ORAL
  Filled 2021-06-15 (×2): qty 1

## 2021-06-15 MED ORDER — SENNOSIDES-DOCUSATE SODIUM 8.6-50 MG PO TABS
1.0000 | ORAL_TABLET | Freq: Two times a day (BID) | ORAL | Status: DC
  Administered 2021-06-15 – 2021-06-16 (×3): 1 via ORAL
  Filled 2021-06-15 (×3): qty 1

## 2021-06-15 MED ORDER — SODIUM CHLORIDE (PF) 0.9 % IJ SOLN
INTRAMUSCULAR | Status: DC | PRN
  Administered 2021-06-15: 120 mL via INTRAMUSCULAR

## 2021-06-15 MED ORDER — FAMOTIDINE 20 MG PO TABS
ORAL_TABLET | ORAL | Status: AC
  Administered 2021-06-15: 20 mg via ORAL
  Filled 2021-06-15: qty 1

## 2021-06-15 MED ORDER — BUPIVACAINE HCL (PF) 0.5 % IJ SOLN
INTRAMUSCULAR | Status: DC | PRN
  Administered 2021-06-15: 3 mL

## 2021-06-15 MED ORDER — TRAMADOL HCL 50 MG PO TABS
50.0000 mg | ORAL_TABLET | ORAL | Status: DC | PRN
  Administered 2021-06-15 – 2021-06-16 (×2): 100 mg via ORAL
  Filled 2021-06-15 (×2): qty 2

## 2021-06-15 MED ORDER — CELECOXIB 200 MG PO CAPS
400.0000 mg | ORAL_CAPSULE | Freq: Once | ORAL | Status: AC
Start: 2021-06-15 — End: 2021-06-15

## 2021-06-15 MED ORDER — ACETAMINOPHEN 10 MG/ML IV SOLN
INTRAVENOUS | Status: AC
  Filled 2021-06-15: qty 100

## 2021-06-15 MED ORDER — MIDAZOLAM HCL 5 MG/5ML IJ SOLN
INTRAMUSCULAR | Status: DC | PRN
  Administered 2021-06-15: 2 mg via INTRAVENOUS

## 2021-06-15 MED ORDER — ENOXAPARIN SODIUM 30 MG/0.3ML IJ SOSY
30.0000 mg | PREFILLED_SYRINGE | Freq: Two times a day (BID) | INTRAMUSCULAR | Status: DC
  Administered 2021-06-15 – 2021-06-16 (×2): 30 mg via SUBCUTANEOUS
  Filled 2021-06-15 (×2): qty 0.3

## 2021-06-15 MED ORDER — PHENYLEPHRINE HCL-NACL 20-0.9 MG/250ML-% IV SOLN
INTRAVENOUS | Status: DC | PRN
  Administered 2021-06-15: 30 ug/min via INTRAVENOUS

## 2021-06-15 MED ORDER — OYSTER SHELL CALCIUM/D3 500-5 MG-MCG PO TABS
1.0000 | ORAL_TABLET | Freq: Every day | ORAL | Status: DC
  Administered 2021-06-15 – 2021-06-16 (×2): 1 via ORAL
  Filled 2021-06-15 (×2): qty 1

## 2021-06-15 MED ORDER — GABAPENTIN 300 MG PO CAPS
ORAL_CAPSULE | ORAL | Status: AC
  Administered 2021-06-15: 300 mg via ORAL
  Filled 2021-06-15: qty 1

## 2021-06-15 MED ORDER — FAMOTIDINE 20 MG PO TABS: 20.0000 mg | ORAL_TABLET | Freq: Once | ORAL | Status: AC

## 2021-06-15 MED ORDER — TRANEXAMIC ACID-NACL 1000-0.7 MG/100ML-% IV SOLN
INTRAVENOUS | Status: AC
  Filled 2021-06-15: qty 100

## 2021-06-15 MED ORDER — HYDROMORPHONE HCL 1 MG/ML IJ SOLN: 0.5000 mg | INTRAMUSCULAR | Status: DC | PRN

## 2021-06-15 MED ORDER — SODIUM CHLORIDE 0.9 % IR SOLN
Status: DC | PRN
  Administered 2021-06-15: 3000 mL

## 2021-06-15 MED ORDER — CEFAZOLIN SODIUM-DEXTROSE 2-4 GM/100ML-% IV SOLN
2.0000 g | INTRAVENOUS | Status: AC
  Administered 2021-06-15: 2 g via INTRAVENOUS

## 2021-06-15 MED ORDER — MIDAZOLAM HCL 2 MG/2ML IJ SOLN
INTRAMUSCULAR | Status: AC
Start: 2021-06-15 — End: ?
  Filled 2021-06-15: qty 2

## 2021-06-15 MED ORDER — PANTOPRAZOLE SODIUM 40 MG PO TBEC
40.0000 mg | DELAYED_RELEASE_TABLET | Freq: Two times a day (BID) | ORAL | Status: DC
  Administered 2021-06-15 – 2021-06-16 (×3): 40 mg via ORAL
  Filled 2021-06-15 (×3): qty 1

## 2021-06-15 MED ORDER — BISACODYL 10 MG RE SUPP: 10.0000 mg | Freq: Every day | RECTAL | Status: DC | PRN

## 2021-06-15 MED ORDER — GLYCOPYRROLATE 0.2 MG/ML IJ SOLN
INTRAMUSCULAR | Status: DC | PRN
  Administered 2021-06-15: .2 mg via INTRAVENOUS

## 2021-06-15 MED ORDER — ONDANSETRON HCL 4 MG/2ML IJ SOLN
INTRAMUSCULAR | Status: DC | PRN
  Administered 2021-06-15: 4 mg via INTRAVENOUS

## 2021-06-15 MED ORDER — ACETAMINOPHEN 10 MG/ML IV SOLN
1000.0000 mg | Freq: Four times a day (QID) | INTRAVENOUS | Status: DC
  Administered 2021-06-15 (×2): 1000 mg via INTRAVENOUS
  Filled 2021-06-15 (×3): qty 100

## 2021-06-15 MED ORDER — CELECOXIB 200 MG PO CAPS
200.0000 mg | ORAL_CAPSULE | Freq: Two times a day (BID) | ORAL | Status: DC
  Administered 2021-06-16: 200 mg via ORAL
  Filled 2021-06-15: qty 1

## 2021-06-15 MED ORDER — OXYCODONE HCL 5 MG PO TABS
10.0000 mg | ORAL_TABLET | ORAL | Status: DC | PRN
  Administered 2021-06-15: 10 mg via ORAL
  Filled 2021-06-15 (×2): qty 2

## 2021-06-15 MED ORDER — DEXAMETHASONE SODIUM PHOSPHATE 10 MG/ML IJ SOLN: 8.0000 mg | Freq: Once | INTRAMUSCULAR | Status: AC

## 2021-06-15 MED ORDER — TRANEXAMIC ACID-NACL 1000-0.7 MG/100ML-% IV SOLN
1000.0000 mg | Freq: Once | INTRAVENOUS | Status: AC
Start: 2021-06-15 — End: 2021-06-15

## 2021-06-15 MED ORDER — CHLORHEXIDINE GLUCONATE 0.12 % MT SOLN
OROMUCOSAL | Status: AC
  Administered 2021-06-15: 15 mL via OROMUCOSAL
  Filled 2021-06-15: qty 15

## 2021-06-15 MED ORDER — DIPHENHYDRAMINE HCL 12.5 MG/5ML PO ELIX: 12.5000 mg | ORAL_SOLUTION | ORAL | Status: DC | PRN

## 2021-06-15 MED ORDER — PROPOFOL 10 MG/ML IV BOLUS: INTRAVENOUS | Status: DC | PRN

## 2021-06-15 MED ORDER — ACETAMINOPHEN 325 MG PO TABS
325.0000 mg | ORAL_TABLET | Freq: Four times a day (QID) | ORAL | Status: DC | PRN
  Administered 2021-06-15: 325 mg via ORAL
  Filled 2021-06-15: qty 1

## 2021-06-15 MED ORDER — SURGIPHOR WOUND IRRIGATION SYSTEM - OPTIME
TOPICAL | Status: DC | PRN
  Administered 2021-06-15: 1

## 2021-06-15 MED ORDER — CEFAZOLIN SODIUM-DEXTROSE 2-4 GM/100ML-% IV SOLN
2.0000 g | Freq: Four times a day (QID) | INTRAVENOUS | Status: AC
  Administered 2021-06-15 (×2): 2 g via INTRAVENOUS
  Filled 2021-06-15 (×2): qty 100

## 2021-06-15 SURGICAL SUPPLY — 71 items
ATTUNE PSFEM RTSZ4 NARCEM KNEE (Femur) ×1 IMPLANT
ATTUNE PSRP INSR SZ4 7 KNEE (Insert) ×1 IMPLANT
BASEPLATE TIBIAL ROTATING SZ 4 (Knees) ×1 IMPLANT
BATTERY INSTRU NAVIGATION (MISCELLANEOUS) ×8 IMPLANT
BLADE CLIPPER SURG (BLADE) ×1 IMPLANT
BLADE SAW 70X12.5 (BLADE) ×2 IMPLANT
BLADE SAW 90X13X1.19 OSCILLAT (BLADE) ×2 IMPLANT
BLADE SAW 90X25X1.19 OSCILLAT (BLADE) ×2 IMPLANT
CEMENT HV SMART SET (Cement) ×2 IMPLANT
COOLER POLAR GLACIER W/PUMP (MISCELLANEOUS) ×2 IMPLANT
CUFF TOURN SGL QUICK 24 (TOURNIQUET CUFF)
CUFF TOURN SGL QUICK 34 (TOURNIQUET CUFF)
CUFF TRNQT CYL 24X4X16.5-23 (TOURNIQUET CUFF) IMPLANT
CUFF TRNQT CYL 34X4.125X (TOURNIQUET CUFF) IMPLANT
DRAPE 3/4 80X56 (DRAPES) ×2 IMPLANT
DRAPE INCISE IOBAN 66X45 STRL (DRAPES) IMPLANT
DRSG DERMACEA 8X12 NADH (GAUZE/BANDAGES/DRESSINGS) ×2 IMPLANT
DRSG MEPILEX SACRM 8.7X9.8 (GAUZE/BANDAGES/DRESSINGS) ×2 IMPLANT
DRSG OPSITE POSTOP 4X14 (GAUZE/BANDAGES/DRESSINGS) ×2 IMPLANT
DRSG TEGADERM 4X4.75 (GAUZE/BANDAGES/DRESSINGS) ×2 IMPLANT
DURAPREP 26ML APPLICATOR (WOUND CARE) ×4 IMPLANT
ELECT CAUTERY BLADE 6.4 (BLADE) ×2 IMPLANT
ELECT REM PT RETURN 9FT ADLT (ELECTROSURGICAL) ×2
ELECTRODE REM PT RTRN 9FT ADLT (ELECTROSURGICAL) ×1 IMPLANT
EX-PIN ORTHOLOCK NAV 4X150 (PIN) ×4 IMPLANT
GLOVE BIOGEL M STRL SZ7.5 (GLOVE) ×8 IMPLANT
GLOVE BIOGEL PI IND STRL 8 (GLOVE) ×1 IMPLANT
GLOVE BIOGEL PI INDICATOR 8 (GLOVE) ×1
GLOVE SURG UNDER POLY LF SZ7.5 (GLOVE) ×2 IMPLANT
GOWN STRL REUS W/ TWL LRG LVL3 (GOWN DISPOSABLE) ×2 IMPLANT
GOWN STRL REUS W/ TWL XL LVL3 (GOWN DISPOSABLE) ×1 IMPLANT
GOWN STRL REUS W/TWL LRG LVL3 (GOWN DISPOSABLE) ×2
GOWN STRL REUS W/TWL XL LVL3 (GOWN DISPOSABLE) ×1
HEMOVAC 400CC 10FR (MISCELLANEOUS) ×2 IMPLANT
HOLDER FOLEY CATH W/STRAP (MISCELLANEOUS) ×2 IMPLANT
HOLSTER ELECTROSUGICAL PENCIL (MISCELLANEOUS) ×1 IMPLANT
HOOD PEEL AWAY FLYTE STAYCOOL (MISCELLANEOUS) ×4 IMPLANT
IV NS IRRIG 3000ML ARTHROMATIC (IV SOLUTION) ×2 IMPLANT
KIT TURNOVER KIT A (KITS) ×2 IMPLANT
KNIFE SCULPS 14X20 (INSTRUMENTS) ×2 IMPLANT
MANIFOLD NEPTUNE II (INSTRUMENTS) ×4 IMPLANT
NDL SPNL 20GX3.5 QUINCKE YW (NEEDLE) ×2 IMPLANT
NEEDLE SPNL 20GX3.5 QUINCKE YW (NEEDLE) ×4 IMPLANT
NS IRRIG 500ML POUR BTL (IV SOLUTION) ×2 IMPLANT
PACK TOTAL KNEE (MISCELLANEOUS) ×2 IMPLANT
PAD ABD DERMACEA PRESS 5X9 (GAUZE/BANDAGES/DRESSINGS) ×4 IMPLANT
PAD WRAPON POLAR KNEE (MISCELLANEOUS) ×1 IMPLANT
PATELLA MEDIAL ATTUN 35MM KNEE (Knees) ×1 IMPLANT
PIN DRILL FIX HALF THREAD (BIT) ×4 IMPLANT
PIN FIXATION 1/8DIA X 3INL (PIN) ×2 IMPLANT
PULSAVAC PLUS IRRIG FAN TIP (DISPOSABLE) ×2
SOL PREP PVP 2OZ (MISCELLANEOUS) ×2
SOLUTION IRRIG SURGIPHOR (IV SOLUTION) ×2 IMPLANT
SOLUTION PREP PVP 2OZ (MISCELLANEOUS) ×1 IMPLANT
SPONGE DRAIN TRACH 4X4 STRL 2S (GAUZE/BANDAGES/DRESSINGS) ×2 IMPLANT
STAPLER SKIN PROX 35W (STAPLE) ×2 IMPLANT
STOCKINETTE IMPERV 14X48 (MISCELLANEOUS) ×1 IMPLANT
STRAP TIBIA SHORT (MISCELLANEOUS) ×2 IMPLANT
SUCTION FRAZIER HANDLE 10FR (MISCELLANEOUS) ×1
SUCTION TUBE FRAZIER 10FR DISP (MISCELLANEOUS) ×1 IMPLANT
SUT VIC AB 0 CT1 36 (SUTURE) ×4 IMPLANT
SUT VIC AB 1 CT1 36 (SUTURE) ×4 IMPLANT
SUT VIC AB 2-0 CT2 27 (SUTURE) ×2 IMPLANT
SYR 30ML LL (SYRINGE) ×4 IMPLANT
TIP FAN IRRIG PULSAVAC PLUS (DISPOSABLE) ×1 IMPLANT
TOWEL OR 17X26 4PK STRL BLUE (TOWEL DISPOSABLE) IMPLANT
TOWER CARTRIDGE SMART MIX (DISPOSABLE) ×2 IMPLANT
TRAY FOLEY MTR SLVR 16FR STAT (SET/KITS/TRAYS/PACK) ×2 IMPLANT
WATER STERILE IRR 1000ML POUR (IV SOLUTION) ×1 IMPLANT
WATER STERILE IRR 500ML POUR (IV SOLUTION) ×1 IMPLANT
WRAPON POLAR PAD KNEE (MISCELLANEOUS) ×2

## 2021-06-15 NOTE — Op Note (Signed)
OPERATIVE NOTE  DATE OF SURGERY:  06/15/2021  PATIENT NAME:  TOMEIKA WEINMANN   DOB: 1945/06/30  MRN: [id number]  PRE-OPERATIVE DIAGNOSIS: Degenerative arthrosis of the right knee, primary  POST-OPERATIVE DIAGNOSIS:  Same  PROCEDURE:  Right total knee arthroplasty using computer-assisted navigation  SURGEON:  Marciano Sequin. M.D.  ANESTHESIA: spinal  ESTIMATED BLOOD LOSS: 50 mL  FLUIDS REPLACED: 1800 mL of crystalloid  TOURNIQUET TIME: 76 minutes  DRAINS: 2 medium Hemovac drains  SOFT TISSUE RELEASES: Anterior cruciate ligament, posterior cruciate ligament, deep medial collateral ligament, patellofemoral ligament  IMPLANTS UTILIZED: DePuy Attune size 4N posterior stabilized femoral component (cemented), size 4 rotating platform tibial component (cemented), 35 mm medialized dome patella (cemented), and a 7 mm stabilized rotating platform polyethylene insert.  INDICATIONS FOR SURGERY: GRACELYNNE BENEDICT is a 76 y.o. year old female with a long history of progressive knee pain. X-rays demonstrated severe degenerative changes in tricompartmental fashion. The patient had not seen any significant improvement despite conservative nonsurgical intervention. After discussion of the risks and benefits of surgical intervention, the patient expressed understanding of the risks benefits and agree with plans for total knee arthroplasty.   The risks, benefits, and alternatives were discussed at length including but not limited to the risks of infection, bleeding, nerve injury, stiffness, blood clots, the need for revision surgery, cardiopulmonary complications, among others, and they were willing to proceed.  PROCEDURE IN DETAIL: The patient was brought into the operating room and, after adequate spinal anesthesia was achieved, a tourniquet was placed on the patient's upper thigh. The patient's knee and leg were cleaned and prepped with alcohol and DuraPrep and draped in the usual sterile fashion. A  "timeout" was performed as per usual protocol. The lower extremity was exsanguinated using an Esmarch, and the tourniquet was inflated to 300 mmHg. An anterior longitudinal incision was made followed by a standard mid vastus approach. The deep fibers of the medial collateral ligament were elevated in a subperiosteal fashion off of the medial flare of the tibia so as to maintain a continuous soft tissue sleeve. The patella was subluxed laterally and the patellofemoral ligament was incised. Inspection of the knee demonstrated severe degenerative changes with full-thickness loss of articular cartilage. Osteophytes were debrided using a rongeur. Anterior and posterior cruciate ligaments were excised. Two 4.0 mm Schanz pins were inserted in the femur and into the tibia for attachment of the array of trackers used for computer-assisted navigation. Hip center was identified using a circumduction technique. Distal landmarks were mapped using the computer. The distal femur and proximal tibia were mapped using the computer. The distal femoral cutting guide was positioned using computer-assisted navigation so as to achieve a 5 distal valgus cut. The femur was sized and it was felt that a size 4N femoral component was appropriate. A size 4 femoral cutting guide was positioned and the anterior cut was performed and verified using the computer. This was followed by completion of the posterior and chamfer cuts. Femoral cutting guide for the central box was then positioned in the center box cut was performed.  Attention was then directed to the proximal tibia. Medial and lateral menisci were excised. The extramedullary tibial cutting guide was positioned using computer-assisted navigation so as to achieve a 0 varus-valgus alignment and 3 posterior slope. The cut was performed and verified using the computer. The proximal tibia was sized and it was felt that a size 4 tibial tray was appropriate. Tibial and femoral trials were  inserted followed  by insertion of a 7 mm polyethylene insert. This allowed for excellent mediolateral soft tissue balancing both in flexion and in full extension. Finally, the patella was cut and prepared so as to accommodate a 35 mm medialized dome patella. A patella trial was placed and the knee was placed through a range of motion with excellent patellar tracking appreciated. The femoral trial was removed after debridement of posterior osteophytes. The central post-hole for the tibial component was reamed followed by insertion of a keel punch. Tibial trials were then removed. Cut surfaces of bone were irrigated with copious amounts of normal saline using pulsatile lavage and then suctioned dry. Polymethylmethacrylate cement was prepared in the usual fashion using a vacuum mixer. Cement was applied to the cut surface of the proximal tibia as well as along the undersurface of a size 4 rotating platform tibial component. Tibial component was positioned and impacted into place. Excess cement was removed using [employer] fast streamer. Cement was then applied to the cut surfaces of the femur as well as along the posterior flanges of the size 4N femoral component. The femoral component was positioned and impacted into place. Excess cement was removed using [employer] fast streamer. A 7 mm polyethylene trial was inserted and the knee was brought into full extension with steady axial compression applied. Finally, cement was applied to the backside of a 35 mm medialized dome patella and the patellar component was positioned and patellar clamp applied. Excess cement was removed using [employer] fast streamer. After adequate curing of the cement, the tourniquet was deflated after a total tourniquet time of 76 minutes. Hemostasis was achieved using electrocautery. The knee was irrigated with copious amounts of normal saline using pulsatile lavage followed by 450 ml of Surgiphor and then suctioned dry. 20 mL of 1.3% Exparel and 60 mL of 0.25% Marcaine  in 40 mL of normal saline was injected along the posterior capsule, medial and lateral gutters, and along the arthrotomy site. A 7 mm stabilized rotating platform polyethylene insert was inserted and the knee was placed through a range of motion with excellent mediolateral soft tissue balancing appreciated and excellent patellar tracking noted. 2 medium drains were placed in the wound bed and brought out through separate stab incisions. The medial parapatellar portion of the incision was reapproximated using interrupted sutures of #1 Vicryl. Subcutaneous tissue was approximated in layers using first #0 Vicryl followed #2-0 Vicryl. The skin was approximated with skin staples. A sterile dressing was applied.  The patient tolerated the procedure well and was transported to the recovery room in stable condition.    Rito Lecomte P. Holley Bouche., M.D.

## 2021-06-15 NOTE — Progress Notes (Signed)
Upon waking from anesthesia patient c/o'd of left chest wall discomfort. Anesthesia aware: Dr. Amie Critchley:  at bedside, ekg ordered and reviewed. Medicated with fentanyl as ordered. After 44mns patient states no further c/o's of chest wall discomfort, states "heartburn" Dr. PAmie Critchleycalled regarding heart rate, 49-50's with pac's.  No new ordered. Ok for transfer to 150. Dr. HMarry Guanat bedside, also reviewed EKG, discussed procedure and plan of care. Patient awake/alert x4 without c/o's.

## 2021-06-15 NOTE — Anesthesia Preprocedure Evaluation (Signed)
Anesthesia Evaluation  Patient identified by MRN, date of birth, ID band Patient awake    Reviewed: Allergy & Precautions, NPO status , Patient's Chart, lab work & pertinent test results  History of Anesthesia Complications Negative for: history of anesthetic complications  Airway Mallampati: III  TM Distance: <3 FB Neck ROM: full    Dental  (+) Chipped, Poor Dentition, Implants   Pulmonary neg shortness of breath, COPD, Current Smoker and Patient abstained from smoking.,    Pulmonary exam normal        Cardiovascular Exercise Tolerance: Good (-) angina(-) Past MI and (-) DOE negative cardio ROS Normal cardiovascular exam     Neuro/Psych negative neurological ROS  negative psych ROS   GI/Hepatic negative GI ROS, Neg liver ROS, neg GERD  ,  Endo/Other  negative endocrine ROS  Renal/GU      Musculoskeletal   Abdominal   Peds  Hematology negative hematology ROS (+)   Anesthesia Other Findings Past Medical History: No date: Arthritis     Comment:  fingers, hands, knees No date: Breast cancer ([place])     Comment:  2009 right breast 2009: Cancer [facility])     Comment:  Right breast 2013: Colon cancer ([place]) No date: Degenerative joint disease     Comment:  thumbs No date: Dental bridge present No date: Hyperlipidemia No date: Osteoporosis 2009: Personal history of radiation therapy     Comment:  Right breast No date: Right thyroid nodule  Past Surgical History: No date: BREAST BIOPSY; Left     Comment:  neg- 2009: BREAST EXCISIONAL BIOPSY; Right     Comment:  rad no chemo 2009: BREAST LUMPECTOMY; Right 09/24/2017: CATARACT EXTRACTION W/PHACO; Left     Comment:  Procedure: CATARACT EXTRACTION PHACO AND INTRAOCULAR               LENS PLACEMENT ([place])  LEFT;  Surgeon: Leandrew Koyanagi, MD;  Location: [facility];  Service:               Ophthalmology;  Laterality: Left; 11/19/2017:  CATARACT EXTRACTION W/PHACO; Right     Comment:  Procedure: CATARACT EXTRACTION PHACO AND INTRAOCULAR               LENS PLACEMENT ([place]) IVA TOPICAL;  Surgeon: Leandrew Koyanagi, MD;  Location: [facility];  Service:               Ophthalmology;  Laterality: Right; No date: CHOLECYSTECTOMY 2015: COLON SURGERY     Comment:  colon cancer 07/22/2014: COLONOSCOPY WITH PROPOFOL; N/A     Comment:  Procedure: COLONOSCOPY WITH PROPOFOL;  Surgeon: Lucilla Lame, MD;  Location: [facility];  Service:               Endoscopy;  Laterality: N/A; No date: KNEE ARTHROSCOPY; Right 09/02/2018: KNEE ARTHROSCOPY; Left     Comment:  Procedure: LEFT ARTHROSCOPY KNEE MENISETOMY AND               CHONDROPLASTY;  Surgeon: Dereck Leep, MD;  Location:               [facility];  Service: Orthopedics;  Laterality: Left; 07/22/2014: POLYPECTOMY     Comment:  Procedure: POLYPECTOMY;  Surgeon: Lucilla Lame,  MD;                Location: [facility];  Service: Endoscopy;; No date: TONSILLECTOMY No date: TUBAL LIGATION  BMI    Body Mass Index: 23.55 kg/m      Reproductive/Obstetrics negative OB ROS                             Anesthesia Physical Anesthesia Plan  ASA: 2  Anesthesia Plan: Spinal   Post-op Pain Management:    Induction:   PONV Risk Score and Plan:   Airway Management Planned: Natural Airway and Nasal Cannula  Additional Equipment:   Intra-op Plan:   Post-operative Plan:   Informed Consent: I have reviewed the patients History and Physical, chart, labs and discussed the procedure including the risks, benefits and alternatives for the proposed anesthesia with the patient or authorized representative who has indicated his/her understanding and acceptance.     Dental Advisory Given  Plan Discussed with: Anesthesiologist, CRNA and Surgeon  Anesthesia Plan Comments: (Patient reports no bleeding problems  and no anticoagulant use.  Plan for spinal with backup GA  Patient consented for risks of anesthesia including but not limited to:  - adverse reactions to medications - damage to eyes, teeth, lips or other oral mucosa - nerve damage due to positioning  - risk of bleeding, infection and or nerve damage from spinal that could lead to paralysis - risk of headache or failed spinal - damage to teeth, lips or other oral mucosa - sore throat or hoarseness - damage to heart, brain, nerves, lungs, other parts of body or loss of life  Patient voiced understanding.)        Anesthesia Quick Evaluation

## 2021-06-15 NOTE — Anesthesia Procedure Notes (Signed)
Date/Time: 06/15/2021 8:34 AM Performed by: Nelda Marseille, CRNA Pre-anesthesia Checklist: Patient identified, Emergency Drugs available, Suction available, Patient being monitored and Timeout performed Oxygen Delivery Method: Simple face mask

## 2021-06-15 NOTE — Plan of Care (Signed)
  Problem: Education: Goal: Knowledge of General Education information will improve Description: Including pain rating scale, medication(s)/side effects and non-pharmacologic comfort measures Outcome: Progressing   Problem: Clinical Measurements: Goal: Ability to maintain clinical measurements within normal limits will improve Outcome: Progressing   

## 2021-06-15 NOTE — Evaluation (Signed)
Physical Therapy Evaluation Patient Details Name: Laura Graham MRN: [id number] DOB: [birth year] Today's Date: 06/15/2021  History of Present Illness  Pt is a 76 y.o. 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  PMH includes breast CA, R breast lumpectomy, colon CA, HLD, COPD.  Clinical Impression  Prior to surgery, pt was independent with ambulation; lives with her husband on main level of home with 3 STE (no railing).  Pt reports having some L chest wall discomfort this morning but none this afternoon.  Currently pt is SBA semi-supine to sitting edge of bed; CGA to min assist with transfers using RW; and CGA to ambulate a few feet bed to recliner with RW use.  2/10 R knee pain beginning/during/end of session.  Able to perform R LE SLR independently (so no KI utilized).  R knee AROM 10-65 degrees.  Pt would benefit from skilled PT to address noted impairments and functional limitations (see below for any additional details).  Upon hospital discharge, pt would benefit from [place].    Recommendations for follow up therapy are one component of a multi-disciplinary discharge planning process, led by the attending physician.  Recommendations may be updated based on patient status, additional functional criteria and insurance authorization.  Follow Up Recommendations Home health PT    Assistance Recommended at Discharge Set up Supervision/Assistance  Patient can return home with the following  A little help with walking and/or transfers;A little help with bathing/dressing/bathroom;Assistance with cooking/housework;Assist for transportation;Help with stairs or ramp for entrance    Equipment Recommendations Rolling walker (2 wheels);BSC/3in1  Recommendations for Other Services  OT consult    Functional Status Assessment Patient has had a recent decline in their functional status and demonstrates the ability to make significant improvements in function in a reasonable and  predictable amount of time.     Precautions / Restrictions Precautions Precautions: Fall;Knee Precaution Booklet Issued: Yes (comment) Restrictions Weight Bearing Restrictions: Yes RLE Weight Bearing: Weight bearing as tolerated      Mobility  Bed Mobility Overal bed mobility: Needs Assistance Bed Mobility: Supine to Sit     Supine to sit: Supervision, HOB elevated     General bed mobility comments: increased effort to perform on own; SBA for lines    Transfers Overall transfer level: Needs assistance Equipment used: Rolling walker (2 wheels) Transfers: Sit to/from Stand Sit to Stand: Min guard, Min assist           General transfer comment: vc's for UE/LE placement and overall technique    Ambulation/Gait Ambulation/Gait assistance: Min guard Gait Distance (Feet): 3 Feet (bed to recliner) Assistive device: Rolling walker (2 wheels)   Gait velocity: decreased     General Gait Details: antalgic; decreased stance time R LE; vc's for overall gait technique and walker use  Stairs            Wheelchair Mobility    Modified Rankin (Stroke Patients Only)       Balance Overall balance assessment: Needs assistance Sitting-balance support: No upper extremity supported, Feet supported Sitting balance-Leahy Scale: Good Sitting balance - Comments: steady sitting reaching within BOS   Standing balance support: Single extremity supported Standing balance-Leahy Scale: Fair Standing balance comment: steady standing with at least single UE support                             Pertinent Vitals/Pain Pain Assessment Pain Assessment: 0-10 Pain Score: 2  Pain  Location: R knee Pain Descriptors / Indicators: Sore, Tender Pain Intervention(s): Limited activity within patient's tolerance, Monitored during session, Premedicated before session, Repositioned, Other (comment) (polar care applied) HR 52-55 bpm and O2 sats WFL on room air during sessions  activities.    Home Living Family/patient expects to be discharged to:: Private residence Living Arrangements: Spouse/significant other Available Help at Discharge: Family Type of Home: House Home Access: Stairs to enter Entrance Stairs-Rails: None (wall on L side) Entrance Stairs-Number of Steps: 3   Home Layout: One level;Laundry or work area in basement (pt's husband able to do laundry in basement) Home Equipment: Civil engineer, contracting - built in;Cane - single point;Rollator (4 wheels)      Prior Function Prior Level of Function : Independent/Modified Independent                     Hand Dominance        Extremity/Trunk Assessment   Upper Extremity Assessment Upper Extremity Assessment: Overall WFL for tasks assessed    Lower Extremity Assessment Lower Extremity Assessment: RLE deficits/detail (L LE WFL) RLE Deficits / Details: able to perform R LE SLR independently; at least 3/5 AROM hip flexion and ankle DF/PF RLE: Unable to fully assess due to pain    Cervical / Trunk Assessment Cervical / Trunk Assessment: Normal  Communication   Communication: No difficulties  Cognition Arousal/Alertness: Awake/alert Behavior During Therapy: WFL for tasks assessed/performed Overall Cognitive Status: Within Functional Limits for tasks assessed                                          General Comments General comments (skin integrity, edema, etc.): L LE hemovac in place.  Nursing cleared pt for participation in physical therapy.  Pt agreeable to PT session.  Pt's husband present during session.    Exercises Total Joint Exercises Ankle Circles/Pumps: AROM, Strengthening, Both, 10 reps, Supine Quad Sets: AROM, Strengthening, Both, 10 reps, Supine Short Arc Quad: AROM, Strengthening, Right, 10 reps, Supine Heel Slides: AAROM, Strengthening, Right, 10 reps, Supine Hip ABduction/ADduction: AAROM, Strengthening, Right, 10 reps, Supine Straight Leg Raises: AROM,  Strengthening, Right, 10 reps, Supine Goniometric ROM: R knee AROM 10-65 degrees   Assessment/Plan    PT Assessment Patient needs continued PT services  PT Problem List Decreased strength;Decreased range of motion;Decreased activity tolerance;Decreased balance;Decreased mobility;Decreased knowledge of use of DME;Decreased knowledge of precautions;Pain;Decreased skin integrity       PT Treatment Interventions DME instruction;Gait training;Stair training;Functional mobility training;Therapeutic activities;Therapeutic exercise;Balance training;Patient/family education    PT Goals (Current goals can be found in the Care Plan section)  Acute Rehab PT Goals Patient Stated Goal: to go home PT Goal Formulation: With patient Time For Goal Achievement: 06/29/21 Potential to Achieve Goals: Good    Frequency BID     Co-evaluation               AM-PAC PT "6 Clicks" Mobility  Outcome Measure Help needed turning from your back to your side while in a flat bed without using bedrails?: None Help needed moving from lying on your back to sitting on the side of a flat bed without using bedrails?: A Little Help needed moving to and from a bed to a chair (including a wheelchair)?: A Little Help needed standing up from a chair using your arms (e.g., wheelchair or bedside chair)?: A Little Help needed to walk  in hospital room?: A Little Help needed climbing 3-5 steps with a railing? : A Lot 6 Click Score: 18    End of Session Equipment Utilized During Treatment: Gait belt Activity Tolerance: Patient tolerated treatment well Patient left: in chair;with call bell/phone within reach;with chair alarm set;with family/visitor present;with SCD's reapplied;Other (comment) (B heels floating via towel rolls; polar care in place) Nurse Communication: Mobility status;Precautions;Weight bearing status PT Visit Diagnosis: Other abnormalities of gait and mobility (R26.89);Muscle weakness (generalized)  (M62.81);Pain Pain - Right/Left: Right Pain - part of body: Knee    Time: 2003-7944 PT Time Calculation (min) (ACUTE ONLY): 32 min   Charges:   PT Evaluation $PT Eval Low Complexity: 1 Low PT Treatments $Therapeutic Exercise: 8-22 mins       Leitha Bleak, PT 06/15/21, 5:52 PM

## 2021-06-15 NOTE — Transfer of Care (Signed)
Immediate Anesthesia Transfer of Care Note  Patient: Laura Graham  Procedure(s) Performed: COMPUTER ASSISTED TOTAL KNEE ARTHROPLASTY - RNFA (Right: Knee)  Patient Location: PACU  Anesthesia Type:Spinal  Level of Consciousness: awake, alert  and oriented  Airway & Oxygen Therapy: Patient Spontanous Breathing and Patient connected to face mask oxygen  Post-op Assessment: Report given to RN and Post -op Vital signs reviewed and stable  Post vital signs: Reviewed and stable  Last Vitals:  Vitals Value Taken Time  BP    Temp    Pulse 71 06/15/21 1128  Resp 16 06/15/21 1128  SpO2 94 % 06/15/21 1128  Vitals shown include unvalidated device data.  Last Pain:  Vitals:   06/15/21 0643  TempSrc: Temporal  PainSc: 0-No pain         Complications: No notable events documented.

## 2021-06-15 NOTE — H&P (Signed)
The patient has been re-examined, and the chart reviewed, and there have been no interval changes to the documented history and physical.    The risks, benefits, and alternatives have been discussed at length. The patient expressed understanding of the risks benefits and agreed with plans for surgical intervention.  Jassica Zazueta P. Hiawatha Merriott, Jr. M.D.    

## 2021-06-15 NOTE — Anesthesia Procedure Notes (Signed)
Spinal  Patient location during procedure: OR Start time: 06/15/2021 8:06 AM End time: 06/15/2021 8:11 AM Reason for block: surgical anesthesia Staffing Performed: resident/CRNA  Anesthesiologist: Piscitello, Precious Haws, MD Resident/CRNA: Nelda Marseille, CRNA Preanesthetic Checklist Completed: patient identified, IV checked, site marked, risks and benefits discussed, surgical consent, monitors and equipment checked, pre-op evaluation and timeout performed Spinal Block Patient position: sitting Prep: ChloraPrep Patient monitoring: heart rate, continuous pulse ox, blood pressure and cardiac monitor Approach: midline Location: L3-4 Injection technique: single-shot Needle Needle type: Whitacre and Introducer  Needle gauge: 25 G Needle length: 9 cm Assessment Sensory level: T10 Events: CSF return Additional Notes Sterile aseptic technique used throughout the procedure.  Negative paresthesia. Negative blood return. Positive free-flowing CSF. Expiration date of kit checked and confirmed. Patient tolerated procedure well, without complications.

## 2021-06-16 DIAGNOSIS — M1711 Unilateral primary osteoarthritis, right knee: Secondary | ICD-10-CM | POA: Diagnosis not present

## 2021-06-16 MED ORDER — TRAMADOL HCL 50 MG PO TABS: 50.0000 mg | ORAL_TABLET | ORAL | 0 refills | Status: AC | PRN

## 2021-06-16 MED ORDER — ACETAMINOPHEN 10 MG/ML IV SOLN: 1000.0000 mg | Freq: Four times a day (QID) | INTRAVENOUS | Status: DC

## 2021-06-16 MED ORDER — ONDANSETRON HCL 4 MG PO TABS: 4.0000 mg | ORAL_TABLET | Freq: Four times a day (QID) | ORAL | 0 refills | Status: AC | PRN

## 2021-06-16 MED ORDER — ENOXAPARIN SODIUM 40 MG/0.4ML IJ SOSY
40.0000 mg | PREFILLED_SYRINGE | INTRAMUSCULAR | 0 refills | Status: AC
Start: 2021-06-16 — End: ?

## 2021-06-16 MED ORDER — OXYCODONE HCL 5 MG PO TABS: 5.0000 mg | ORAL_TABLET | ORAL | 0 refills | Status: AC | PRN

## 2021-06-16 MED ORDER — ACETAMINOPHEN 500 MG PO TABS: 500.0000 mg | ORAL_TABLET | Freq: Four times a day (QID) | ORAL | 0 refills | Status: AC | PRN

## 2021-06-16 MED ORDER — CELECOXIB 200 MG PO CAPS: 200.0000 mg | ORAL_CAPSULE | Freq: Two times a day (BID) | ORAL | 0 refills | Status: AC

## 2021-06-16 NOTE — Evaluation (Signed)
Occupational Therapy Evaluation Patient Details Name: Laura Graham MRN: [id number] DOB: [birth year] Today's Date: 06/16/2021   History of Present Illness Pt is a 76 y.o. 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  PMH includes breast CA, R breast lumpectomy, colon CA, HLD, COPD.   Clinical Impression   Pt seen for OT evaluation this date.  Pt lives at home with her husband, independent prior to admission with all tasks.  Pt denies any pain during session but has had some post surgery and well controlled.  Pt was able to demonstrate bed mobility this date with modified independence, ambulated to and from the bathroom with supervision due to IV pole and ensuring pathway with walker.  Modified independent with toilet transfers and grooming at sink.  Pt was instructed on LB dressing skills and able to demonstrate donning underwear, pants and socks without assist.  She will not require any additional OT at discharge.  Pt able to meet goals during evaluation and OT will discharge at this time.       Recommendations for follow up therapy are one component of a multi-disciplinary discharge planning process, led by the attending physician.  Recommendations may be updated based on patient status, additional functional criteria and insurance authorization.   Follow Up Recommendations  No OT follow up    Assistance Recommended at Discharge None  Patient can return home with the following Assistance with cooking/housework    Functional Status Assessment  Patient has had a recent decline in their functional status and demonstrates the ability to make significant improvements in function in a reasonable and predictable amount of time.  Equipment Recommendations       Recommendations for Other Services       Precautions / Restrictions Precautions Precautions: Fall;Knee Restrictions Weight Bearing Restrictions: Yes RLE Weight Bearing: Weight bearing as tolerated       Mobility Bed Mobility Overal bed mobility: Modified Independent Bed Mobility: Supine to Sit     Supine to sit: Modified independent (Device/Increase time)          Transfers Overall transfer level: Modified independent Equipment used: Rolling walker (2 wheels) Transfers: Sit to/from Stand Sit to Stand: Modified independent (Device/Increase time)                  Balance Overall balance assessment: Modified Independent Sitting-balance support: No upper extremity supported, Feet supported Sitting balance-Leahy Scale: Good     Standing balance support: Single extremity supported Standing balance-Leahy Scale: Good                             ADL either performed or assessed with clinical judgement   ADL Overall ADL's : Modified independent                                       General ADL Comments: Pt seen for lower body dressing this date, able to complete without adaptive equipment, able to don and doff socks, underwear and pants.   Toileting with supervision to modified independence to and from the bathroom with RW.  Do not anticipate any OT needs at discharge.     Vision Baseline Vision/History: 0 No visual deficits       Perception     Praxis      Pertinent Vitals/Pain Pain Assessment Pain Assessment: No/denies pain Pain Score:  0-No pain     Hand Dominance Right   Extremity/Trunk Assessment Upper Extremity Assessment Upper Extremity Assessment: Overall WFL for tasks assessed   Lower Extremity Assessment Lower Extremity Assessment: Defer to PT evaluation       Communication Communication Communication: No difficulties   Cognition Arousal/Alertness: Awake/alert Behavior During Therapy: WFL for tasks assessed/performed Overall Cognitive Status: Within Functional Limits for tasks assessed                                       General Comments   Pt seen this date for toileting, grooming, dressing  with post surgical instruction, modified independence as noted above.     Exercises     Shoulder Instructions      Home Living Family/patient expects to be discharged to:: Private residence Living Arrangements: Spouse/significant other Available Help at Discharge: Family Type of Home: House Home Access: Stairs to enter [employer] of Steps: 3 Entrance Stairs-Rails: None Home Layout: One level;Laundry or work area in basement     [employer] Shower/Tub: [facility];Tub/shower unit   Bathroom Toilet: Handicapped height     Home Equipment: Shower seat - built in;Cane - single point;Rollator (4 wheels)          Prior Functioning/Environment Prior Level of Function : Independent/Modified Independent                        OT Problem List: Decreased strength;Pain      OT Treatment/Interventions:      OT Goals(Current goals can be found in the care plan section) Acute Rehab OT Goals OT Goal Formulation: With patient Time For Goal Achievement: 06/16/21 Potential to Achieve Goals: Good ADL Goals Pt Will Perform Lower Body Dressing: with modified independence Pt Will Transfer to Toilet: with modified independence  OT Frequency:      Co-evaluation              AM-PAC OT "6 Clicks" Daily Activity     Outcome Measure Help from another person eating meals?: None Help from another person taking care of personal grooming?: None Help from another person toileting, which includes using toliet, bedpan, or urinal?: None Help from another person bathing (including washing, rinsing, drying)?: None Help from another person to put on and taking off regular upper body clothing?: None Help from another person to put on and taking off regular lower body clothing?: None 6 Click Score: 24   End of Session Equipment Utilized During Treatment: Rolling walker (2 wheels);Gait belt  Activity Tolerance: Patient tolerated treatment well Patient left: in bed;with  call bell/phone within reach  OT Visit Diagnosis: Muscle weakness (generalized) (M62.81)                Time: 9983-3825 OT Time Calculation (min): 25 min Charges:  OT General Charges $OT Visit: 1 Visit OT Evaluation $OT Eval Low Complexity: 1 Low OT Treatments $Self Care/Home Management : 8-22 mins Netanya Yazdani T Mannix Kroeker, OTR/L, CLT 06/16/2021, 10:28 AM

## 2021-06-16 NOTE — Plan of Care (Signed)
  Problem: Education: Goal: Knowledge of General Education information will improve Description: Including pain rating scale, medication(s)/side effects and non-pharmacologic comfort measures Outcome: Adequate for Discharge   Problem: Health Behavior/Discharge Planning: Goal: Ability to manage health-related needs will improve Outcome: Adequate for Discharge   Problem: Clinical Measurements: Goal: Ability to maintain clinical measurements within normal limits will improve Outcome: Adequate for Discharge Goal: Will remain free from infection Outcome: Adequate for Discharge Goal: Diagnostic test results will improve Outcome: Adequate for Discharge Goal: Respiratory complications will improve Outcome: Adequate for Discharge Goal: Cardiovascular complication will be avoided Outcome: Adequate for Discharge   Problem: Coping: Goal: Level of anxiety will decrease Outcome: Adequate for Discharge   Problem: Elimination: Goal: Will not experience complications related to bowel motility Outcome: Adequate for Discharge Goal: Will not experience complications related to urinary retention Outcome: Adequate for Discharge   Problem: Pain Managment: Goal: General experience of comfort will improve Outcome: Adequate for Discharge   Problem: Safety: Goal: Ability to remain free from injury will improve Outcome: Adequate for Discharge   Problem: Skin Integrity: Goal: Risk for impaired skin integrity will decrease Outcome: Adequate for Discharge   Problem: Elimination: Goal: Will not experience complications related to bowel motility Outcome: Adequate for Discharge Goal: Will not experience complications related to urinary retention Outcome: Adequate for Discharge

## 2021-06-16 NOTE — Progress Notes (Signed)
Physical Therapy Treatment Patient Details Name: Laura Graham MRN: [id number] DOB: Oct 13, 1945 Today's Date: 06/16/2021   History of Present Illness Pt is a 76 y.o. 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  PMH includes breast CA, R breast lumpectomy, colon CA, HLD, COPD.    PT Comments    Pt ready and willing to participate in PT.  Pt is progressing with functional mobility requiring less overall assist: mod I with transfers and bed mobility and supervision level with gait(ambulated around nursing station at supervision level) and stair negotiation. Pt has no barriers to d/c from PT at this setting  and will benefit from HHPT to continue to work on R knee ROM ( Goniometric ROM for today in supine: R knee AAROM 5-87 degrees), strengthening, balance, and increased independence with functional mobility.    Recommendations for follow up therapy are one component of a multi-disciplinary discharge planning process, led by the attending physician.  Recommendations may be updated based on patient status, additional functional criteria and insurance authorization.  Follow Up Recommendations  Home health PT     Assistance Recommended at Discharge Intermittent Supervision/Assistance  Patient can return home with the following A little help with walking and/or transfers;A little help with bathing/dressing/bathroom;Assistance with cooking/housework;Assist for transportation;Help with stairs or ramp for entrance   Equipment Recommendations  Rolling walker (2 wheels);BSC/3in1    Recommendations for Other Services       Precautions / Restrictions Precautions Precautions: Fall;Knee Precaution Booklet Issued: Yes (comment) Restrictions Weight Bearing Restrictions: Yes RLE Weight Bearing: Weight bearing as tolerated     Mobility  Bed Mobility Overal bed mobility: Modified Independent Bed Mobility: Supine to Sit     Supine to sit: Modified independent (Device/Increase  time)     General bed mobility comments: increased effort to perform on own; SBA for lines    Transfers Overall transfer level: Modified independent Equipment used: Rolling walker (2 wheels) Transfers: Sit to/from Stand Sit to Stand: Modified independent (Device/Increase time)           General transfer comment: Pt demonstrated safety with UE/LE placement.    Ambulation/Gait Ambulation/Gait assistance: Min guard Gait Distance (Feet): 160' Assistive device: Rolling walker (2 wheels) Gait Pattern/deviations: Step-through pattern, WFL(Within Functional Limits) Gait velocity: decreased     General Gait Details: reciprocal gait with good initial heel strike.   Stairs   Stairs assistance: Supervision (cues for sfe technique) Stair Management: No rails, Forwards, With walker Number of Stairs: 2 General stair comments: 2 steps, wide enough for walker placement, no rails.   Wheelchair Mobility    Modified Rankin (Stroke Patients Only)       Balance Overall balance assessment: Modified Independent Sitting-balance support: No upper extremity supported, Feet supported Sitting balance-Leahy Scale: Good Sitting balance - Comments: steady sitting reaching outside BOS   Standing balance support: Single extremity supported Standing balance-Leahy Scale: Good Standing balance comment: steady standing with at least single UE support                            Cognition Arousal/Alertness: Awake/alert Behavior During Therapy: WFL for tasks assessed/performed Overall Cognitive Status: Within Functional Limits for tasks assessed                                          Exercises Total Joint Exercises  Ankle Circles/Pumps: AROM, Strengthening, 10 reps, Supine, Right Quad Sets: AROM, Strengthening, 10 reps, Supine, Right Short Arc Quad: AROM, Strengthening, Right, 10 reps, Supine Hip ABduction/ADduction: Strengthening, Right, 10 reps, Supine,  AROM Straight Leg Raises: AROM, Strengthening, Right, 10 reps, Supine Long Arc Quad: AROM, Strengthening, Right, 10 reps, Seated Goniometric ROM: R knee AAROM 5-87 degrees.    General Comments        Pertinent Vitals/Pain Pain Assessment Pain Assessment: 0-10 Pain Score: 2  Pain Location: R knee Pain Descriptors / Indicators: Sore, Tender Pain Intervention(s): Monitored during session, Premedicated before session    Home Living Family/patient expects to be discharged to:: Private residence Living Arrangements: Spouse/significant other Available Help at Discharge: Family Type of Home: House Home Access: Stairs to enter Entrance Stairs-Rails: None Entrance Stairs-Number of Steps: 3   Home Layout: One level;Laundry or work area in [place]: Civil engineer, contracting - built in;Cane - single TEFL teacher (4 wheels)      Prior Function            PT Goals (current goals can now be found in the care plan section) Acute Rehab PT Goals Patient Stated Goal: to go home PT Goal Formulation: With patient Time For Goal Achievement: 06/29/21 Potential to Achieve Goals: Good Progress towards PT goals: Progressing toward goals    Frequency    BID      PT Plan      Co-evaluation              AM-PAC PT "6 Clicks" Mobility   Outcome Measure  Help needed turning from your back to your side while in a flat bed without using bedrails?: None Help needed moving from lying on your back to sitting on the side of a flat bed without using bedrails?: None Help needed moving to and from a bed to a chair (including a wheelchair)?: None Help needed standing up from a chair using your arms (e.g., wheelchair or bedside chair)?: None Help needed to walk in hospital room?: A Little Help needed climbing 3-5 steps with a railing? : A Little 6 Click Score: 22    End of Session Equipment Utilized During Treatment: Gait belt Activity Tolerance: Patient tolerated treatment  well Patient left: in chair;with call bell/phone within reach;with family/visitor present Nurse Communication: Mobility status PT Visit Diagnosis: Other abnormalities of gait and mobility (R26.89);Muscle weakness (generalized) (M62.81);Pain Pain - Right/Left: Right Pain - part of body: Knee     Time: 1020-1045 PT Time Calculation (min) (ACUTE ONLY): 25 min  Charges:  $Gait Training: 8-22 mins $Therapeutic Exercise: 8-22 mins                     Bjorn Loser, PTA  06/16/21, 11:37 AM

## 2021-06-16 NOTE — TOC Transition Note (Signed)
Transition of Care [facility]) - CM/SW Discharge Note   Patient Details  Name: Laura Graham MRN: [id number] Date of Birth: February 08, 1945  [facility] of [facility]) CM/SW Contact:  Boris Sharper, LCSW Phone Number: 06/16/2021, 9:42 AM   Clinical Narrative:    Pt medically stable for discharge per MD. Pt will be transported home by her husband. CSW notified [facility] of discharge as well as ordered pt a RW and BSC to be delivered upon discharge.     Final next level of care: [place] Barriers to Discharge: Equipment Delay   Patient Goals and CMS Choice        Discharge Placement                  Name of family member notified: Vernard Gambles Patient and family notified of of transfer: 06/16/21  Discharge Plan and Services                  DME Agency: [employer] Date DME Agency Contacted: 06/16/21 Time DME Agency Contacted: [phone] Representative spoke with at DME Agency: [place]: PT [place]: [place] Date [place]: 06/16/21 Time [place]: [phone] Representative spoke with at [place]: [place]  Social Determinants of Health ([place]) Interventions     Readmission Risk Interventions     View : No data to display.

## 2021-06-16 NOTE — Discharge Summary (Signed)
Physician Discharge Summary  Patient ID: Laura Graham MRN: [id number] DOB/AGE: 74-Apr-1947 74 y.o.  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   Cancer of right female breast ([place]) 05/10/2016   Osteoporosis 04/24/2015   Borderline high cholesterol 02/21/2015   Cigarette smoker 02/21/2015   History of breast cancer 02/21/2015   Right thyroid nodule 02/21/2015   S/P lumpectomy, right breast 02/21/2015   Personal history of other malignant neoplasm of large intestine    History of intestinal bypass    Benign neoplasm of sigmoid colon    Benign neoplasm of ascending colon     Past Medical History:  Diagnosis Date   Arthritis    fingers, hands, knees   Breast cancer ([place])    2009 right breast   Cancer ([place]) 2009   Right breast   Colon cancer ([place]) 2013   Degenerative joint disease    thumbs   Dental bridge present    Hyperlipidemia    Osteoporosis    Personal history of radiation therapy 2009   Right breast   Right thyroid nodule      Transfusion: None.   Consultants (if any):   Discharged Condition: Improved  Hospital Course: Laura RUYBAL is an 76 y.o. female who was admitted 06/15/2021 with a diagnosis of primary degenerative arthrosis of the right knee and went to the operating room on 06/15/2021 and underwent the above named procedures.    Surgeries: Procedure(s): COMPUTER ASSISTED TOTAL KNEE ARTHROPLASTY - RNFA on 06/15/2021 Patient tolerated the surgery well. Taken to PACU where she was stabilized and then transferred to the orthopedic floor.  Started on Lovenox '30mg'$  q 12 hrs. Foot pumps applied bilaterally at 80 mm. Heels elevated on bed with rolled towels. No evidence of DVT. Negative [place]. Physical therapy started on day #1 for  gait training and transfer. OT started day #1 for ADL and assisted devices.  Patient's IV and hemovac were removed on POD1.  Foley was removed shortly after surgery in the OR.  Implants: DePuy Attune size 4N posterior stabilized femoral component (cemented), size 4 rotating platform tibial component (cemented), 35 mm medialized dome patella (cemented), and a 7 mm stabilized rotating platform polyethylene insert.  She was given perioperative antibiotics:  Anti-infectives (From admission, onward)    Start     Dose/Rate Route Frequency Ordered Stop   06/15/21 1430  ceFAZolin (ANCEF) IVPB 2g/100 mL premix        2 g 200 mL/hr over 30 Minutes Intravenous Every 6 hours 06/15/21 1302 06/15/21 2043   06/15/21 0649  ceFAZolin (ANCEF) 2-4 GM/100ML-% IVPB       Note to Pharmacy: Jeanene Erb E: cabinet override      06/15/21 0649 06/15/21 0842   06/15/21 0600  ceFAZolin (ANCEF) IVPB 2g/100 mL premix        2 g 200 mL/hr over 30 Minutes Intravenous On call to O.R. 06/15/21 0146 06/15/21 0840     .  She was given sequential compression devices, early ambulation, and Lovenox for DVT prophylaxis.  She benefited maximally from the hospital stay and there were no complications.    Recent vital signs:  Vitals:   06/16/21 0521 06/16/21 0731  BP: (!) 105/39 (!) 102/42  Pulse: (!) 44 (!) 46  Resp: 17 16  Temp: 98.3  F (36.8 C) 98.3 F (36.8 C)  SpO2: 97% 100%    Recent laboratory studies:  Lab Results  Component Value Date   HGB 13.2 06/05/2021   HGB 12.5 12/28/2018   HGB 12.6 06/26/2018   Lab Results  Component Value Date   WBC 8.0 06/05/2021   PLT 234 06/05/2021   No results found for: INR Lab Results  Component Value Date   NA 141 06/05/2021   K 4.2 06/05/2021   CL 105 06/05/2021   CO2 29 06/05/2021   BUN 16 06/05/2021   CREATININE 0.83 06/05/2021   GLUCOSE 94 06/05/2021    Discharge Medications:   Allergies as of 06/16/2021   No Known Allergies      Medication  List     TAKE these medications    acetaminophen 500 MG tablet Commonly known as: TYLENOL Take 1-2 tablets (500-1,000 mg total) by mouth every 6 (six) hours as needed.   CALTRATE 600+D3 PO Take 1 tablet by mouth daily.   celecoxib 200 MG capsule Commonly known as: CELEBREX Take 1 capsule (200 mg total) by mouth 2 (two) times daily.   enoxaparin 40 MG/0.4ML injection Commonly known as: LOVENOX Inject 0.4 mLs (40 mg total) into the skin daily.   EQL Vitamin D3 50 MCG (2000 UT) Caps Generic drug: Cholecalciferol Take 2,000 Units by mouth daily.   Glucosamine Chondroitin Adv Tabs Take 1 tablet by mouth daily in the afternoon.   ibuprofen 200 MG tablet Commonly known as: ADVIL Take 200 mg by mouth every 6 (six) hours as needed for headache or moderate pain.   ondansetron 4 MG tablet Commonly known as: ZOFRAN Take 1 tablet (4 mg total) by mouth every 6 (six) hours as needed for nausea.   oxyCODONE 5 MG immediate release tablet Commonly known as: Oxy IR/ROXICODONE Take 1-2 tablets (5-10 mg total) by mouth every 4 (four) hours as needed for severe pain.   traMADol 50 MG tablet Commonly known as: ULTRAM Take 1-2 tablets (50-100 mg total) by mouth every 4 (four) hours as needed for moderate pain.               Durable Medical Equipment  (From admission, onward)           Start     Ordered   06/15/21 1302  DME Walker rolling  Once       Question:  Patient needs a walker to treat with the following condition  Answer:  Total knee replacement status   06/15/21 1302   06/15/21 1302  DME Bedside commode  Once       Question:  Patient needs a bedside commode to treat with the following condition  Answer:  Total knee replacement status   06/15/21 1302            Diagnostic Studies: DG Knee Right Port  Result Date: 06/15/2021 CLINICAL DATA:  A 76 year old female presents for evaluation of postoperative pain. EXAM: PORTABLE RIGHT KNEE - 1-2 VIEW COMPARISON:  MRI  of the knee from 2014. FINDINGS: Skin staples project over the knee. Surgical drains are present in the soft tissues. Post RIGHT total knee arthroplasty. No acute or unexpected findings. Well-defined lucencies in the tibia and in the femur with similar pattern suggest prior bone anchor placement or ghost tracks from previous pin placement. There is a small joint effusion. There are small amounts of soft tissue gas following recent surgery. No unexpected radiographic findings. IMPRESSION: 1. Post RIGHT total knee arthroplasty without  evidence of immediate complication. 2. Small joint effusion and local soft tissue gas. 3. Ghost tracks likely from prior hardware in the distal femur and proximal tibia. Correlate with prior surgical history. Electronically Signed   By: Zetta Bills M.D.   On: 06/15/2021 11:43    Disposition: Plan for discharge home today pending progress with therapy.  Apply TED hose stockings bilaterally prior to discharge.     Follow-up Information     Fausto Skillern, PA-C Follow up on 06/29/2021.   Specialty: Orthopedic Surgery Why: at 9:45am Contact information: [place] [address] [phone]         Dereck Leep, MD Follow up on 07/26/2021.   Specialty: Orthopedic Surgery Why: at 2:30pm Contact information: [place] [address] [phone]                Signed: Judson Roch PA-C 06/16/2021, 9:10 AM

## 2021-06-16 NOTE — Progress Notes (Signed)
  Subjective: 1 Day Post-Op Procedure(s) (LRB): COMPUTER ASSISTED TOTAL KNEE ARTHROPLASTY - RNFA (Right) Patient reports pain as mild.   Patient is well, and has had no acute complaints or problems Plan is to go Home after hospital stay. Negative for chest pain and shortness of breath Fever: no Gastrointestinal:Negative for nausea and vomiting Patient is passing gas without pain this morning. Urinating well this morning.  Objective: Vital signs in last 24 hours: Temp:  [97.5 F (36.4 C)-98.3 F (36.8 C)] 98.3 F (36.8 C) (06/03 0731) Pulse Rate:  [44-57] 46 (06/03 0731) Resp:  [7-17] 16 (06/03 0731) BP: (97-117)/(39-58) 102/42 (06/03 0731) SpO2:  [95 %-100 %] 100 % (06/03 0731)  Intake/Output from previous day:  Intake/Output Summary (Last 24 hours) at 06/16/2021 0903 Last data filed at 06/16/2021 0800 Gross per 24 hour  Intake 4290.32 ml  Output 1265 ml  Net 3025.32 ml    Intake/Output this shift: Total I/O In: 847 [P.O.:847] Out: -   Labs: No results for input(s): HGB in the last 72 hours. No results for input(s): WBC, RBC, HCT, PLT in the last 72 hours. No results for input(s): NA, K, CL, CO2, BUN, CREATININE, GLUCOSE, CALCIUM in the last 72 hours. No results for input(s): LABPT, INR in the last 72 hours.   EXAM General - Patient is Alert, Appropriate, and Oriented Extremity - Neurovascular intact Dorsiflexion/Plantar flexion intact No cellulitis present Compartment soft Dressing/Incision - Bulky dressing intact to the right leg. Bulky dressing removed, Hemovac removed without issue.  4x4 with tegaderm applied. [place] without any bloody drainage present. Motor Function - intact, moving foot and toes well on exam.  Abdomen soft with intact bowel sounds.  Past Medical History:  Diagnosis Date   Arthritis    fingers, hands, knees   Breast cancer ([place])    2009 right breast   Cancer [facility]) 2009   Right breast   Colon cancer [facility]) 2013   Degenerative joint  disease    thumbs   Dental bridge present    Hyperlipidemia    Osteoporosis    Personal history of radiation therapy 2009   Right breast   Right thyroid nodule     Assessment/Plan: 1 Day Post-Op Procedure(s) (LRB): COMPUTER ASSISTED TOTAL KNEE ARTHROPLASTY - RNFA (Right) Principal Problem:   Total knee replacement status  Estimated body mass index is 23.55 kg/m as calculated from the following:   Height as of 06/05/21: '5\' 3"'$  (1.6 m).   Weight as of this encounter: 60.3 kg. Advance diet Up with therapy D/C IV fluids when tolerating po intake.  Vitals reviewed, BP 102/42.  Will see how she does with therapy, if symptomatic will initiate IV bolus. Patient is passing gas without pain this morning. Hemovac removed this morning, 4x4 with tegaderm applied. Plan for discharge home this afternoon pending progress with PT.  DVT Prophylaxis - Lovenox and TED hose Weight-Bearing as tolerated to right leg  J. Cameron Proud, PA-C [facility] Orthopaedic Surgery 06/16/2021, 9:03 AM

## 2021-06-17 NOTE — Anesthesia Postprocedure Evaluation (Signed)
Anesthesia Post Note  Patient: LATERRICA LIBMAN  Procedure(s) Performed: COMPUTER ASSISTED TOTAL KNEE ARTHROPLASTY - RNFA (Right: Knee)  Anesthesia Type: Spinal Level of consciousness: oriented and awake and alert Pain management: pain level controlled Vital Signs Assessment: post-procedure vital signs reviewed and stable Respiratory status: spontaneous breathing and respiratory function stable Cardiovascular status: blood pressure returned to baseline and stable Postop Assessment: no headache, no backache and no apparent nausea or vomiting Anesthetic complications: no Comments: Patient discharged prior to post operative evaluation by anesthesiology staff. Vital signs and notes reviewed with no apparent complications.    No notable events documented.   Last Vitals:  Vitals:   06/16/21 0521 06/16/21 0731  BP: (!) 105/39 (!) 102/42  Pulse: (!) 44 (!) 46  Resp: 17 16  Temp: 36.8 C 36.8 C  SpO2: 97% 100%    Last Pain:  Vitals:   06/16/21 0847  TempSrc:   PainSc: 2                  Arita Miss

## 2021-06-18 ENCOUNTER — Encounter: Payer: Self-pay | Admitting: Orthopedic Surgery

## 2021-09-12 ENCOUNTER — Other Ambulatory Visit: Payer: Self-pay | Admitting: Family Medicine

## 2021-09-12 DIAGNOSIS — Z1231 Encounter for screening mammogram for malignant neoplasm of breast: Secondary | ICD-10-CM

## 2021-10-09 ENCOUNTER — Ambulatory Visit
Admission: RE | Admit: 2021-10-09 | Discharge: 2021-10-09 | Disposition: A | Discharge status: Home / Self Care | Payer: Medicare Other | Source: Ambulatory Visit | Attending: Family Medicine | Admitting: Family Medicine

## 2021-10-09 DIAGNOSIS — Z1231 Encounter for screening mammogram for malignant neoplasm of breast: Secondary | ICD-10-CM | POA: Insufficient documentation

## 2022-09-19 ENCOUNTER — Other Ambulatory Visit: Payer: Self-pay | Admitting: Family Medicine

## 2022-09-19 DIAGNOSIS — Z1231 Encounter for screening mammogram for malignant neoplasm of breast: Secondary | ICD-10-CM

## 2022-10-30 ENCOUNTER — Ambulatory Visit
Admission: RE | Admit: 2022-10-30 | Discharge: 2022-10-30 | Disposition: A | Discharge status: Home / Self Care | Payer: Medicare Other | Source: Ambulatory Visit | Attending: Family Medicine | Admitting: Family Medicine

## 2022-10-30 DIAGNOSIS — Z1231 Encounter for screening mammogram for malignant neoplasm of breast: Secondary | ICD-10-CM | POA: Insufficient documentation

## 2023-04-07 DIAGNOSIS — M810 Age-related osteoporosis without current pathological fracture: Secondary | ICD-10-CM | POA: Diagnosis not present

## 2023-04-07 DIAGNOSIS — F17210 Nicotine dependence, cigarettes, uncomplicated: Secondary | ICD-10-CM | POA: Diagnosis not present

## 2023-04-07 DIAGNOSIS — E785 Hyperlipidemia, unspecified: Secondary | ICD-10-CM | POA: Diagnosis not present

## 2023-04-07 DIAGNOSIS — Z170 Estrogen receptor positive status [ER+]: Secondary | ICD-10-CM | POA: Diagnosis not present

## 2023-04-07 DIAGNOSIS — C50911 Malignant neoplasm of unspecified site of right female breast: Secondary | ICD-10-CM | POA: Diagnosis not present

## 2023-04-07 DIAGNOSIS — Z0000 Encounter for general adult medical examination without abnormal findings: Secondary | ICD-10-CM | POA: Diagnosis not present

## 2023-04-07 DIAGNOSIS — M129 Arthropathy, unspecified: Secondary | ICD-10-CM | POA: Diagnosis not present

## 2023-04-18 DIAGNOSIS — J3489 Other specified disorders of nose and nasal sinuses: Secondary | ICD-10-CM | POA: Diagnosis not present

## 2023-04-18 DIAGNOSIS — R051 Acute cough: Secondary | ICD-10-CM | POA: Diagnosis not present

## 2023-04-18 DIAGNOSIS — J069 Acute upper respiratory infection, unspecified: Secondary | ICD-10-CM | POA: Diagnosis not present

## 2023-06-12 DIAGNOSIS — Z96651 Presence of right artificial knee joint: Secondary | ICD-10-CM | POA: Diagnosis not present

## 2023-10-08 DIAGNOSIS — E785 Hyperlipidemia, unspecified: Secondary | ICD-10-CM | POA: Diagnosis not present

## 2023-10-22 DIAGNOSIS — Z1331 Encounter for screening for depression: Secondary | ICD-10-CM | POA: Diagnosis not present

## 2023-10-22 DIAGNOSIS — Z170 Estrogen receptor positive status [ER+]: Secondary | ICD-10-CM | POA: Diagnosis not present

## 2023-10-22 DIAGNOSIS — C50911 Malignant neoplasm of unspecified site of right female breast: Secondary | ICD-10-CM | POA: Diagnosis not present

## 2023-10-22 DIAGNOSIS — M129 Arthropathy, unspecified: Secondary | ICD-10-CM | POA: Diagnosis not present

## 2023-10-22 DIAGNOSIS — M810 Age-related osteoporosis without current pathological fracture: Secondary | ICD-10-CM | POA: Diagnosis not present

## 2023-10-22 DIAGNOSIS — E785 Hyperlipidemia, unspecified: Secondary | ICD-10-CM | POA: Diagnosis not present

## 2023-10-22 DIAGNOSIS — F17210 Nicotine dependence, cigarettes, uncomplicated: Secondary | ICD-10-CM | POA: Diagnosis not present

## 2023-10-27 ENCOUNTER — Other Ambulatory Visit: Payer: Self-pay | Admitting: Family Medicine

## 2023-10-27 DIAGNOSIS — Z1231 Encounter for screening mammogram for malignant neoplasm of breast: Secondary | ICD-10-CM

## 2023-11-19 ENCOUNTER — Encounter: Payer: Self-pay | Admitting: Oncology

## 2023-11-26 ENCOUNTER — Ambulatory Visit
Admission: RE | Admit: 2023-11-26 | Discharge: 2023-11-26 | Disposition: A | Discharge status: Home / Self Care | Source: Ambulatory Visit | Attending: Family Medicine | Admitting: Family Medicine

## 2023-11-26 ENCOUNTER — Encounter: Payer: Self-pay | Admitting: Oncology

## 2023-11-26 DIAGNOSIS — Z1231 Encounter for screening mammogram for malignant neoplasm of breast: Secondary | ICD-10-CM | POA: Insufficient documentation
